# Patient Record
Sex: Female | Born: 1980 | Race: White | Hispanic: No | Marital: Married | Smoking: Never smoker
Health system: Southern US, Community
[De-identification: ages and names within clinical notes are randomized; demographics above are authoritative.]

## PROBLEM LIST (undated history)

## (undated) DIAGNOSIS — B019 Varicella without complication: Secondary | ICD-10-CM

## (undated) DIAGNOSIS — N390 Urinary tract infection, site not specified: Secondary | ICD-10-CM

## (undated) DIAGNOSIS — G8929 Other chronic pain: Secondary | ICD-10-CM

## (undated) DIAGNOSIS — R079 Chest pain, unspecified: Secondary | ICD-10-CM

## (undated) DIAGNOSIS — I10 Essential (primary) hypertension: Secondary | ICD-10-CM

## (undated) DIAGNOSIS — M25519 Pain in unspecified shoulder: Secondary | ICD-10-CM

## (undated) DIAGNOSIS — R519 Headache, unspecified: Secondary | ICD-10-CM

## (undated) HISTORY — DX: Headache, unspecified: R51.9

## (undated) HISTORY — DX: Other chronic pain: G89.29

## (undated) HISTORY — DX: Chest pain, unspecified: R07.9

## (undated) HISTORY — DX: Varicella without complication: B01.9

## (undated) HISTORY — DX: Urinary tract infection, site not specified: N39.0

## (undated) HISTORY — DX: Pain in unspecified shoulder: M25.519

---

## 1998-10-23 ENCOUNTER — Other Ambulatory Visit: Admission: RE | Admit: 1998-10-23 | Discharge: 1998-10-23 | Payer: Self-pay | Admitting: Obstetrics and Gynecology

## 2000-04-14 ENCOUNTER — Other Ambulatory Visit: Admission: RE | Admit: 2000-04-14 | Discharge: 2000-04-14 | Payer: Self-pay | Admitting: Obstetrics and Gynecology

## 2001-04-15 ENCOUNTER — Inpatient Hospital Stay (HOSPITAL_COMMUNITY): Admission: AD | Admit: 2001-04-15 | Discharge: 2001-04-15 | Payer: Self-pay | Admitting: Obstetrics and Gynecology

## 2001-04-22 ENCOUNTER — Inpatient Hospital Stay (HOSPITAL_COMMUNITY): Admission: AD | Admit: 2001-04-22 | Discharge: 2001-04-25 | Payer: Self-pay | Admitting: Obstetrics & Gynecology

## 2001-05-31 ENCOUNTER — Other Ambulatory Visit: Admission: RE | Admit: 2001-05-31 | Discharge: 2001-05-31 | Payer: Self-pay | Admitting: Obstetrics & Gynecology

## 2002-06-05 ENCOUNTER — Other Ambulatory Visit: Admission: RE | Admit: 2002-06-05 | Discharge: 2002-06-05 | Payer: Self-pay | Admitting: Obstetrics & Gynecology

## 2003-09-17 ENCOUNTER — Other Ambulatory Visit: Admission: RE | Admit: 2003-09-17 | Discharge: 2003-09-17 | Payer: Self-pay | Admitting: Obstetrics & Gynecology

## 2003-09-21 ENCOUNTER — Ambulatory Visit (HOSPITAL_COMMUNITY): Admission: RE | Admit: 2003-09-21 | Discharge: 2003-09-21 | Payer: Self-pay | Admitting: Obstetrics & Gynecology

## 2003-09-21 IMAGING — US US PELVIS COMPLETE MODIFY
1 series · 14 of 25 positions shown · non-contrast
Comparison: none

CLINICAL DATA: Lower abdominal pain.
 TRANSABDOMINAL AND TRANSVAGINAL PELVIC ULTRASOUND:
 Realtime multiplanar grayscale ultrasonograhy of the pelvis was performed using a transabdominal and endovaginal approach.
 The uterus measures 9.4 x 3.8 x 5.0 cm and is sonographically normal.  The endometrial stripe is 8 mm in thickness.  
 The right ovary measures 3.7 x 2.8 x 2.2 cm.  The left ovary measures 4.3 x 2.6 x 2.4 cm.  Both have normal sonographic features.  There is no intraperitoneal free fluid identified.  
 IMPRESSION
 Normal sonographic evaluation of the pelvis.

[Series 1: unknown · 0.32mm/px · 14 of 59 slices shown]
[im 1/59]
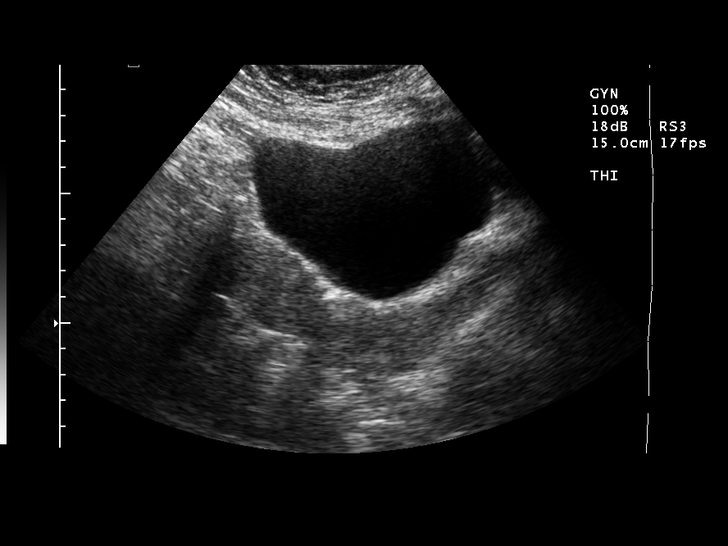
[im 5/59]
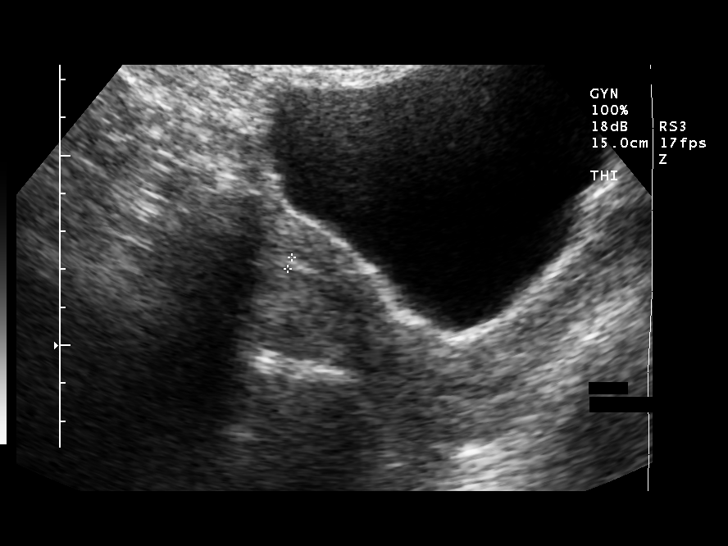
[im 10/59]
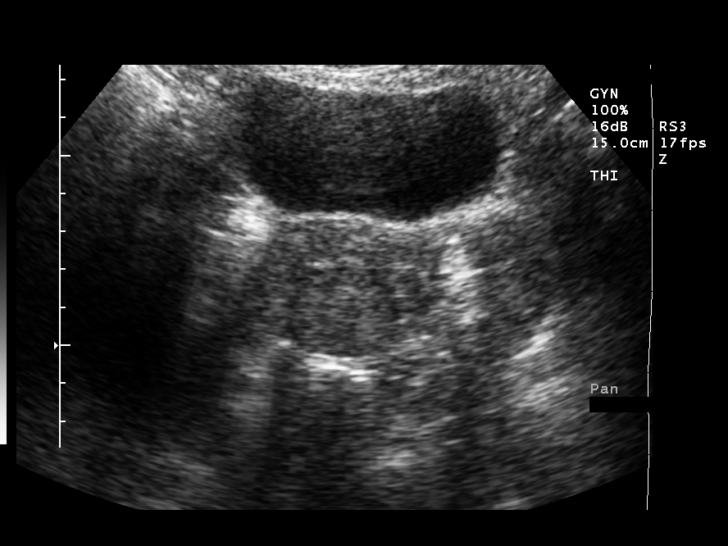
[im 15/59]
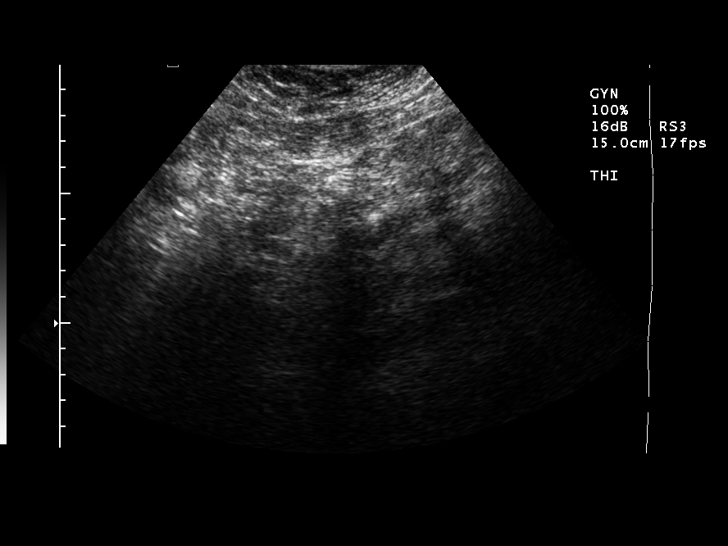
[im 20/59]
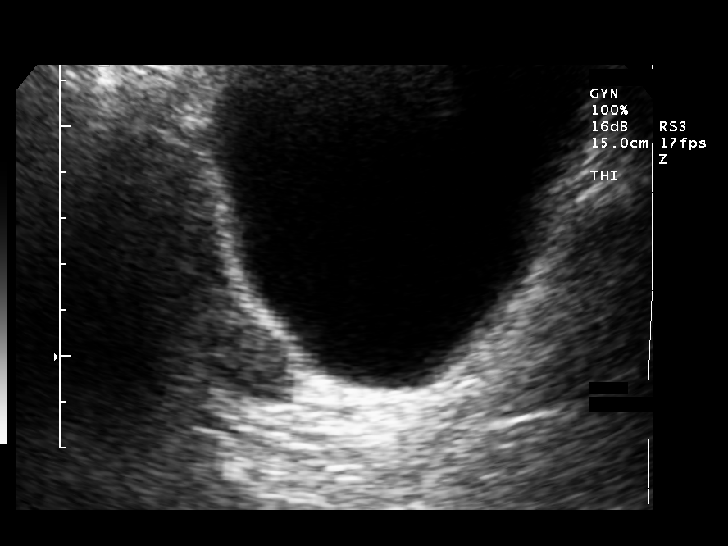
[im 22/59]
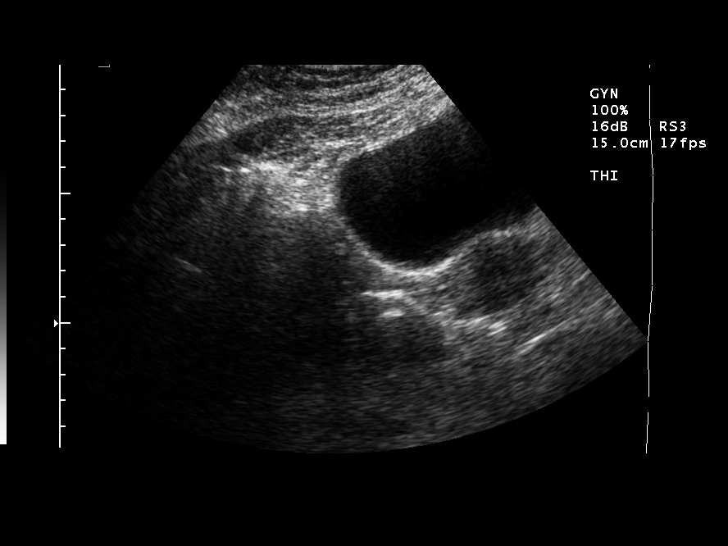
[im 27/59]
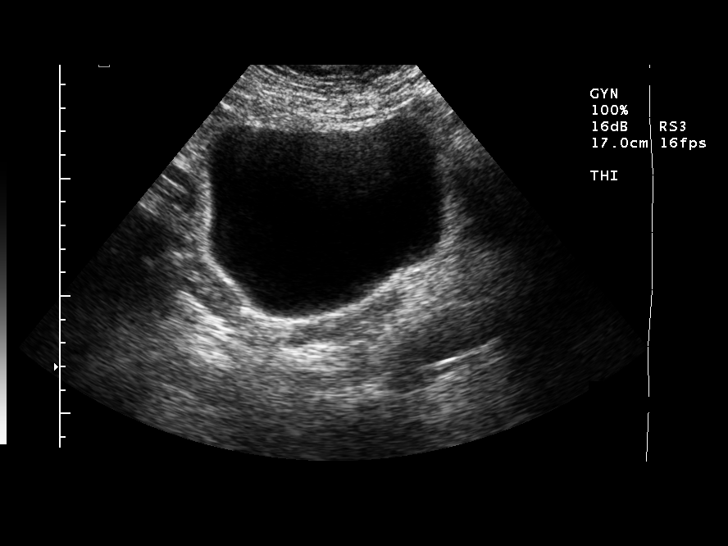
[im 32/59]
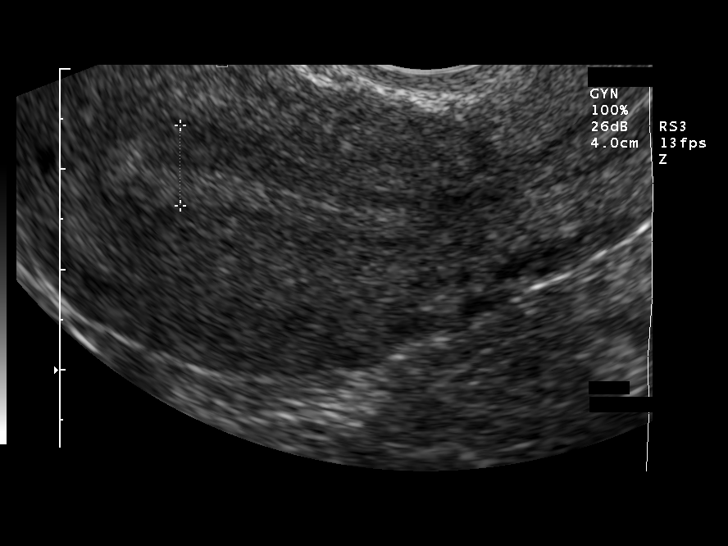
[im 37/59]
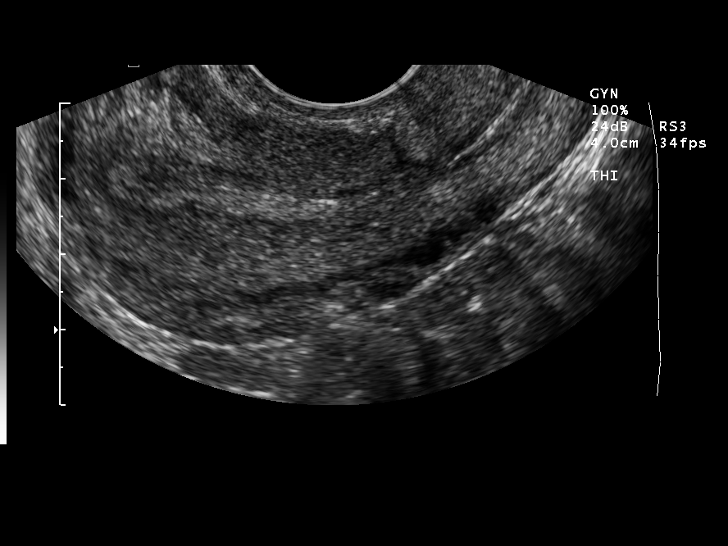
[im 39/59]
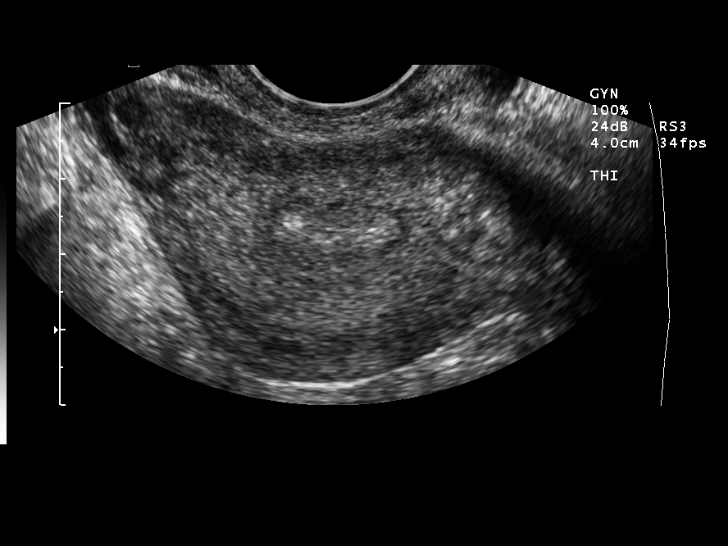
[im 44/59]
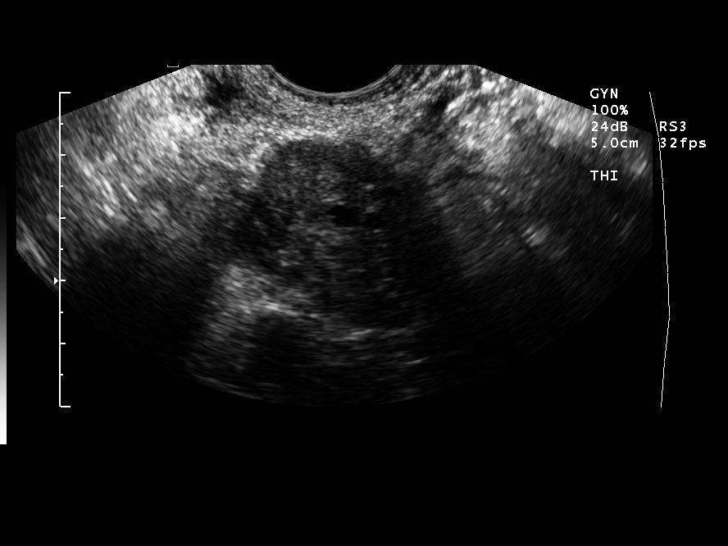
[im 49/59]
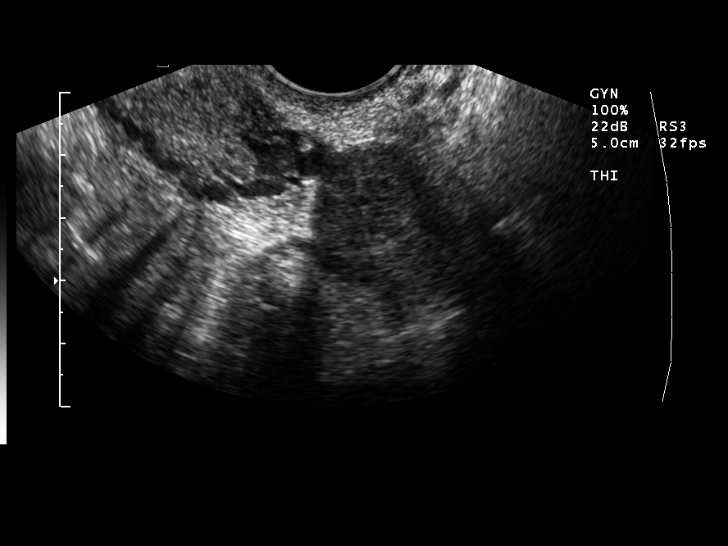
[im 54/59]
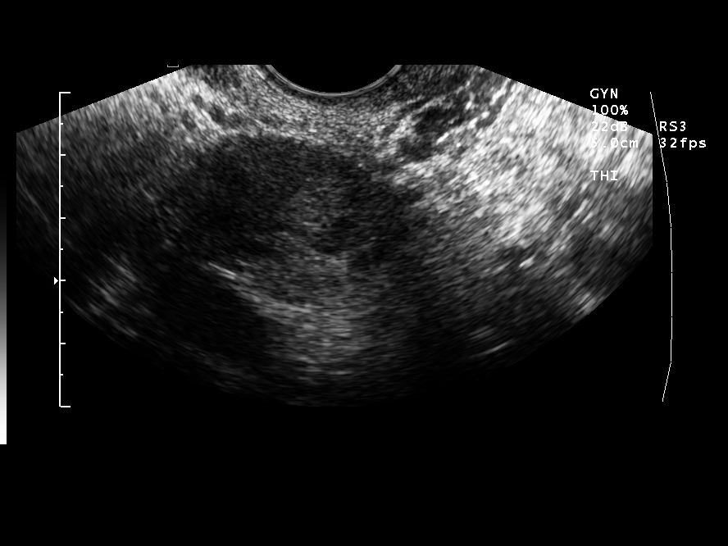
[im 59/59]
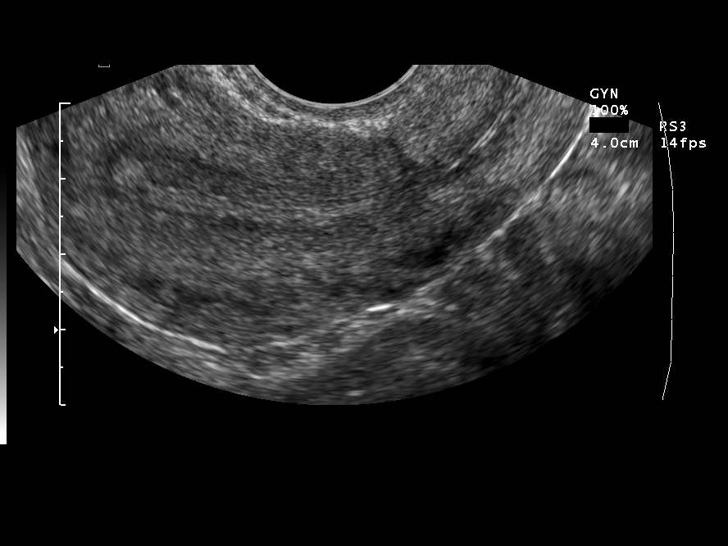

[14 of 25 positions shown; findings below may reference images not displayed]

## 2004-11-05 ENCOUNTER — Ambulatory Visit: Payer: Self-pay | Admitting: Family Medicine

## 2005-01-21 ENCOUNTER — Other Ambulatory Visit: Admission: RE | Admit: 2005-01-21 | Discharge: 2005-01-21 | Payer: Self-pay | Admitting: Obstetrics & Gynecology

## 2005-06-02 ENCOUNTER — Inpatient Hospital Stay (HOSPITAL_COMMUNITY): Admission: AD | Admit: 2005-06-02 | Discharge: 2005-06-02 | Payer: Self-pay | Admitting: Obstetrics and Gynecology

## 2005-08-28 ENCOUNTER — Inpatient Hospital Stay (HOSPITAL_COMMUNITY): Admission: AD | Admit: 2005-08-28 | Discharge: 2005-08-30 | Payer: Self-pay | Admitting: Obstetrics and Gynecology

## 2008-04-19 ENCOUNTER — Ambulatory Visit: Payer: Self-pay | Admitting: Family Medicine

## 2008-04-19 DIAGNOSIS — M5030 Other cervical disc degeneration, unspecified cervical region: Secondary | ICD-10-CM | POA: Insufficient documentation

## 2008-04-19 DIAGNOSIS — M722 Plantar fascial fibromatosis: Secondary | ICD-10-CM

## 2008-08-20 ENCOUNTER — Ambulatory Visit: Payer: Self-pay | Admitting: Family Medicine

## 2008-08-20 DIAGNOSIS — IMO0002 Reserved for concepts with insufficient information to code with codable children: Secondary | ICD-10-CM

## 2009-01-30 ENCOUNTER — Inpatient Hospital Stay (HOSPITAL_COMMUNITY): Admission: RE | Admit: 2009-01-30 | Discharge: 2009-02-01 | Payer: Self-pay | Admitting: Obstetrics & Gynecology

## 2009-01-30 ENCOUNTER — Encounter (INDEPENDENT_AMBULATORY_CARE_PROVIDER_SITE_OTHER): Payer: Self-pay | Admitting: Obstetrics & Gynecology

## 2009-11-26 ENCOUNTER — Ambulatory Visit: Payer: Self-pay | Admitting: Family Medicine

## 2009-11-26 DIAGNOSIS — L2089 Other atopic dermatitis: Secondary | ICD-10-CM

## 2010-05-20 ENCOUNTER — Ambulatory Visit: Payer: Self-pay | Admitting: Family Medicine

## 2010-05-20 DIAGNOSIS — N390 Urinary tract infection, site not specified: Secondary | ICD-10-CM

## 2010-05-20 LAB — CONVERTED CEMR LAB
Bilirubin Urine: NEGATIVE
Glucose, Urine, Semiquant: NEGATIVE
Protein, U semiquant: 30

## 2010-09-16 NOTE — Assessment & Plan Note (Signed)
Summary: UTI? // RS   Vital Signs:  Patient profile:   30 year old female Height:      65 inches Weight:      197 pounds BMI:     32.90 Temp:     98.3 degrees F oral BP sitting:   130 / 90  (left arm) Cuff size:   regular  Vitals Entered By: Kern Reap CMA Duncan Dull) (May 20, 2010 11:12 AM) CC: uti   CC:  uti.  History of Present Illness: [name] is a 30 year old, married female, nonsmoker.......Marland Kitchen birth control BTL....... LMP two weeks ago, who comes in today for evaluation of frequency, dysuria, and hematuria.  She has no fever.  Last urinary tract infection was many years ago.  Review of systems negative  Allergies: No Known Drug Allergies  Past History:  Past medical, surgical, family and social histories (including risk factors) reviewed for relevance to current acute and chronic problems.  Past Medical History: Reviewed history from 04/19/2008 and no changes required. Unremarkable childbirth x 2  Past Surgical History: Reviewed history from 04/20/2007 and no changes required. Denies surgical history  Family History: Reviewed history from 04/19/2008 and no changes required. mother has degenerative disk disease  Social History: Reviewed history from 04/20/2007 and no changes required. 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   Impression & Recommendations:  Problem # 1:  UTI (ICD-599.0) Assessment New  Orders: UA Dipstick w/Micro (automated) (81001)  Her updated medication list for this problem includes:    Septra Ds 800-160 Mg Tabs (Sulfamethoxazole-trimethoprim) .Marland Kitchen... Take 1 tablet by mouth two times a day  Complete Medication List: 1)  Septra Ds 800-160 Mg Tabs  (Sulfamethoxazole-trimethoprim) .... Take 1 tablet by mouth two times a day  Patient Instructions: 1)  drink 30 ounces of water daily. 2)  pyridium       3 times a day as needed for dysuria 3)  Septra DS one twice daily for 10 days.  Return p.r.n. Prescriptions: SEPTRA DS 800-160 MG TABS (SULFAMETHOXAZOLE-TRIMETHOPRIM) Take 1 tablet by mouth two times a day  #20 x 1   Entered and Authorized by:   Roderick Pee MD   Signed by:   Roderick Pee MD on 05/20/2010   Method used:   Electronically to        [facility] (retail)       [address]       [place], [place]  [address]       Ph: [phone]       Fax: [phone]   RxID:   [id number]   Laboratory Results   Urine Tests  Date/Time Received: May 20, 2010   Routine Urinalysis   Color: yellow Appearance: Cloudy Glucose: negative   (Normal Range: Negative) Bilirubin: negative   (Normal Range: Negative) Ketone: trace (5)   (Normal Range: Negative) Spec. Gravity: 1.025   (Normal Range: 1.003-1.035) Blood: large   (Normal Range: Negative) pH: 7.0   (Normal Range: 5.0-8.0) Protein: 30   (Normal Range: Negative) Urobilinogen: 0.2   (Normal Range: 0-1) Nitrite: negative   (Normal Range: Negative) Leukocyte Esterace: moderate   (Normal Range: Negative)    Comments: Kern Reap CMA Duncan Dull)  May 20, 2010 11:50 AM

## 2010-09-16 NOTE — Assessment & Plan Note (Signed)
Summary: skin irritation on eyelid of left eye/painful/scaly patcch/cjr   Vital Signs:  Patient profile:   30 year old female Weight:      184 pounds Temp:     98.0 degrees F oral BP sitting:   128 / 70  (right arm) Cuff size:   regular  Vitals Entered By: Kern Reap CMA Duncan Dull) (November 26, 2009 3:30 PM) CC: left eyelid scaly, rash   CC:  left eyelid scaly and rash.  History of Present Illness: [name] is a 30 year old female, G3, P3, who comes in today for evaluation of a rash on her left upper eyelid.  She has a history of allergic rhinitis and picularis now.  She has a dime-sized lesion on her left upper eyelid that won't go away.  Allergies: No Known Drug Allergies  Social History: Reviewed history from 04/20/2007 and no changes required. 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  Return.

## 2010-11-24 LAB — CBC
HCT: 31.8 % — ABNORMAL LOW (ref 36.0–46.0)
Hemoglobin: 10 g/dL — ABNORMAL LOW (ref 12.0–15.0)
MCV: 88.6 fL (ref 78.0–100.0)
MCV: 87.7 fL (ref 78.0–100.0)
Platelets: 163 10*3/uL (ref 150–400)
RBC: 3.28 MIL/uL — ABNORMAL LOW (ref 3.87–5.11)
WBC: 7.2 10*3/uL (ref 4.0–10.5)
WBC: 6.9 10*3/uL (ref 4.0–10.5)

## 2010-12-30 NOTE — Discharge Summary (Signed)
NAMEMARCELLINA, JONSSON               ACCOUNT NO.:  [id number]   MEDICAL RECORD NO.:  [id number]          PATIENT TYPE:  INP   LOCATION:  9139                          [facility]ACILITY:  [facility]   PHYSICIAN:  Gerrit Friends. Aldona Bar, M.D.   DATE OF BIRTH:  [birth year]   DATE OF ADMISSION:  01/30/2009  DATE OF DISCHARGE:  02/01/2009                               DISCHARGE SUMMARY   DISCHARGE DIAGNOSES:  1. Term pregnancy, delivered 7 pounds 2 ounces female infant, Apgars 2      and 8.  2. Blood type A+.  3. Repeat cesarean section.  4. Desire for tubal sterilization.   PROCEDURE:  Repeat low transverse cesarean section and tubal  sterilization.   SUMMARY:  This 30 year old, gravida 3, now para 3, was admitted on January 30, 2009, having had two previous cesarean sections for repeat cesarean  section at term.  She also desired permanent elective sterilization  procedure.  She was taken to the operating room on January 30, 2009, was  delivered 7 pounds 2 ounces female infant with Apgars 2 and 8, and  underwent a tubal sterilization procedure as well at that time.  Her  postoperative course was totally benign.  Her discharge hemoglobin was  10.0 (preop 11.2) with a white count of 7200, and platelet count of  141,000.  She remained afebrile during her entire hospital course.  She  was bottle feeding at the time of discharge.  She was appropriately  instructed on bottle-feeding.  On the second postop day, her wound was  clean and dry.  She was ambulating well, having normal bowel and bladder  function.  Vital signs were stable.  She was tolerating a regular diet  well, having minimal pain and was desirous of discharge.  Accordingly  she was given all appropriate instructions and understood all  instructions well.  She had some Percocet at home, which she will use as  needed along with over-the-counter Advil or Aleve as directed.  She will  also finish up her vitamins and continue on iron daily until her 4-week  checkup.  She will return to the office on February 04, 2009, to have her  wound inspected and her staples removed and her wound Steri-Stripped  appropriately.  She was given all appropriate instructions per discharge  brochure and understood all instructions well.   CONDITION ON DISCHARGE:  Improved.      Gerrit Friends. Aldona Bar, M.D.  Electronically Signed     RMW/MEDQ  D:  02/01/2009  T:  02/01/2009  Job:  [id number]

## 2010-12-30 NOTE — Op Note (Signed)
[name], [name]               ACCOUNT NO.:  [id number]   MEDICAL RECORD NO.:  [id number]          PATIENT TYPE:  INP   LOCATION:  9139                          [facility]ACILITY:  [facility]   PHYSICIAN:  Gerrit Friends. Aldona Bar, M.D.   DATE OF BIRTH:  1981-04-22   DATE OF PROCEDURE:  01/30/2009  DATE OF DISCHARGE:                               OPERATIVE REPORT   PREOPERATIVE DIAGNOSES:  Previous cesarean section x2, term pregnancy,  desire for repeat cesarean section, and tubal sterilization procedure.   POSTOPERATIVE DIAGNOSES:  Previous cesarean section x2, term pregnancy,  desire for repeat cesarean section, tubal sterilization procedure, and  delivered 7 pounds 2 ounces female infant with Apgars of 2 and 8.   PROCEDURE:  Repeat low transverse cesarean section and tubal  sterilization procedure.   SURGEON:  Gerrit Friends. Aldona Bar, MD   ASSISTANT:  Luvenia Redden, MD   ANESTHESIA:  Spinal, Dr. Jean Rosenthal.   HISTORY:  This 30 year old gravida 3, para 2, now at term, presents for  a repeat cesarean section, having had 2 previous cesarean sections.  She  also desires a permanent elective sterilization procedure and  understands such procedure is meant to be 100% permanent, but  unfortunately, it is not 100% perfect as subsequent pregnancy can  result.   According to her wishes, she is now being taken to the operating for  delivery by repeat cesarean section and will undergo a tubal  sterilization procedure.   DESCRIPTION OF PROCEDURE:  The patient was taken to the operating room,  after satisfactory induction of spinal anesthetic by Dr. Jean Rosenthal, she  was placed in supine position, slightly tilted left, and prepped and  draped in usual fashion with a Foley catheter was placed part of the  prep.  Once good anesthetic levels were documented, procedure was begun.  A Pfannenstiel incision was made with minimal difficulty dissected down  to and through the fascia in low transverse fashion with hemostasis  created at each layer.  Subfascial space was created inferiorly and  superiorly, muscles separated in the midline.  Peritoneum identified and  entered appropriate with care taken to avoid the bowel superiorly and  the bladder inferiorly.  At this time, the vesicouterine peritoneum was  incised in a low transverse fashion and pushed off lower uterine segment  with ease and thereafter sharp incision into the uterus with Metzenbaum  scissors, was then made in a low transverse fashion, extended laterally  with fingers.  Amniotomy was produced - clear fluid - and thereafter,  the vertex position a viable female infant which was somewhat floppy at  the time of birth because of some difficulty with pushing, was  delivered.  There was a double nuchal cord encountered.  Once the infant  was delivered, the cord was clamped and cut, and the infant was passed  off the awaiting team.  Subsequent Apgars were noted to be 2 and 8.  Infant was subsequently taken to the nursery in good condition.   After the cord bloods were collected, the placenta was delivered intact.  The uterus was then exteriorized, rendered free of  any remaining  products of conception.  Good uterine contractility was afforded, and  was slowly given intravenous Pitocin and manual stimulation.  At this  time, the uterine incision was closed using a single layer of #1 Vicryl  in a running locking fashion with several figure-of-eight #1 Vicryls  were placed for additional hemostasis.  At this time, the uterine  incision was dry.  Attention was turned to the tubes and ovaries both  which appeared totally normal.  In the midportion of the right fallopian  tube, a segment was elevated and free tie of #1 plain catgut suture was  tied down.  The knuckle of tube was excised and sent to Pathology.  Hemostasis was adequate.  The similar procedure was carried out on the  left fallopian tube, was accomplished in the tubal sterilization   procedure.   At this time with the uterus firm, uterine incision dry, all counts  correct - no foreign bodies noted to be remaining abdominal cavity.  Uterine incision was reinspected as well.  Abdomen was lavaged of all  free blood and clot.  At this time, closure of the abdomen was begun in  layers.  The abdominal peritoneum was closed with 0 Vicryl in a running  fashion.  Muscle secured with same.  Assured of good subfascial  hemostasis, the fascia was then reapproximated with 0 Vicryl from angle  to midline bilaterally.  Subcutaneous tissues were rendered hemostatic  and staples were used to close the skin.  Sterile pressure was then  applied.  At this time, the patient was taken to the recovery room in  satisfactory condition having tolerated the procedure well.  Estimated  blood loss 500 mL.  All counts correct x2.  At the conclusion of the  procedure, both mother and baby were doing well in their respective  recovery areas.  Pathologic specimen consisted of segment each fallopian  tube.  Condition on arrival in the recovery room was satisfactory.      Gerrit Friends. Aldona Bar, M.D.  Electronically Signed     RMW/MEDQ  D:  01/30/2009  T:  01/30/2009  Job:  [id number]

## 2011-01-02 NOTE — Op Note (Signed)
[facility]  Patient:    [name], [name] Visit Number: [id number] MRN: [id number]          Service Type: OBS Location: [facility] Attending Physician:  Marcelle Overlie Dictated by:   Caralyn Guile. Arlyce Dice, M.D. Proc. Date: 04/22/01                             Operative Report  PREOPERATIVE DIAGNOSIS:       Pregnancy at term, breech presentation.  POSTOPERATIVE DIAGNOSIS:      Pregnancy at term, breech presentation.  OPERATION:                    Primary low transverse cesarean section.  SURGEON:                      Richard D. Arlyce Dice, M.D.  ASSISTANTDebbe Bales A. Edward Jolly, M.D.  ANESTHESIA:                   Spinal.  ESTIMATED BLOOD LOSS:         500 cc.  FINDINGS:                     Female infant, birth weight 6 pounds 15 ounces, Apgar scores 8 and 9.  Normal-appearing tubes and ovaries.  Normal uterus.  INDICATIONS:                  This is a 30 year old, primigravida female who was found to have a breech presentation and attempted external cephalic version was done and this failed.  The following week the patient was seen in the office and was still in the breech presentation and she requested cesarean section for delivery.  DESCRIPTION OF PROCEDURE:     The patient was taken to the operating room and spinal anesthesia was placed.  The abdomen was then prepped and draped in the sterile fashion and a low transverse incision was made and carried down to the level of the fascia.  The fascia was entered transversely.  The rectus muscle was then divided in the midline.  The peritoneum was then identified and entered by blunt dissection and extended sharply.  The lower segment was then incised and entered.  The infant was then delivered in a frank breech presentation.  Cord bloods were obtained and the placenta was then delivered with traction on the cord.  The uterus was bluntly curettaged.  The lower segment was closed with a running  interlocking Vicryl and suture.  The peritoneum was closed with running 3-0 Vicryl sutures.  The rectus muscle was reapposed in the midline with the same suture that was used for the peritoneum.  The fascia was closed with running 0 Vicryl suture and the skin was closed with staples.  The bladder had been catheterized preoperatively and the patient left the operating room in good condition with an indwelling Foley catheter. Dictated by:   Caralyn Guile Arlyce Dice, M.D. Attending Physician:  Marcelle Overlie DD:  04/22/01 TD:  04/23/01 Job: [id number]

## 2011-01-02 NOTE — Op Note (Signed)
NAMEJANELLY, [name]               ACCOUNT NO.:  [id number]   MEDICAL RECORD NO.:  [id number]          PATIENT TYPE:  INP   LOCATION:  9112                          [facility]ACILITY:  [facility]   PHYSICIAN:  Randye Lobo, M.D.   DATE OF BIRTH:  February 06, 1981   DATE OF PROCEDURE:  08/28/2005  DATE OF DISCHARGE:                                 OPERATIVE REPORT   PREOPERATIVE DIAGNOSES:  1.  Intrauterine gestation at 31 plus 6 weeks.  2.  Latent phase labor.  3.  History of prior cesarean section, desires repeat cesarean section.   POSTOPERATIVE DIAGNOSES:  1.  Intrauterine gestation at 45 plus 6 weeks.  2.  Latent phase labor.  3.  History of prior cesarean section, desires repeat cesarean section.   PROCEDURE:  Repeat low segment transverse cesarean section.   SURGEON:  Conley Simmonds, MD.   ANESTHESIA:  Spinal.   IV FLUIDS:  2500 mL of Ringer's lactate.   ESTIMATED BLOOD LOSS:  700 mL.   URINE OUTPUT:  300 mL.   COMPLICATIONS:  None.   INDICATIONS FOR PROCEDURE:  The patient is a 24-year gravida 2, para 1-0-0-  1, Caucasian female at 51 +6 weeks' gestation (EDC of September 05, 2005,  by  first trimester ultrasound), who presented to the maternity admissions at  the [facility] reporting contractions and back pain of  increasing intensity.  The patient denied any leakage of fluid.  Her cervix  was examined and was 1 cm dilated.  The fetal heart rate tracing was  reactive and reassuring, and the contractions were noted to have an  irritable pattern.  The patient was observed and the contractions were  persistent and her cervix was therefore reexamined, and she was noted to be  3-4 cm at that time.  The patient was given a diagnosis of latent phase  labor.  She was scheduled for a repeat cesarean section on September 01, 2005,  after having counseling in the office regarding a repeat cesarean section  versus a VBAC.  A plan was made to again proceed with a repeat cesarean  section at this time after risks and benefits were reviewed.   FINDINGS:  A viable female was delivered at 10:36 a.m.  Apgars were 9 at one  minute and 9 at five minutes.  There was a nuchal cord x1.  The amniotic  fluid was clear.  The weight was 6 pounds 5 ounces.  The placenta had a  three-vessel cord.  The uterus, tubes, and ovaries were unremarkable.   SPECIMENS:  None.   PROCEDURE:  The patient was brought down to the operating room suite from  the maternity admissions area.  In the operating room the fetal heart rate  was reassuring at 140-160 beats.  A spinal anesthetic was administered and  the patient was then placed in the supine position with a left lateral tilt.  The abdomen was sterilely prepped and a Foley catheter sterilely placed in  the bladder.  She was sterilely draped.   After assuring adequate surgical anesthesia, a Pfannenstiel incision was  created sharply with the scalpel along the line of the patient's previous  incision.  This was carried down to the fascia using the scalpel.  The  fascia was incised in the midline and the incision was extended bilaterally  with the Mayo scissors.  The rectus muscles were dissected from the fascia  superiorly and inferiorly using the Mayo scissors.  The rectus muscles were  then sharply divided in the midline.  The parietal peritoneum was elevated  with two hemostat clamps was entered sharply with a Metzenbaum scissors.  The peritoneal incision was extended cranially and caudally.   The lower uterine segment was exposed with a bladder retractor and a bladder  flap was sharply created.  A transverse lower uterine segment incision was  then created with the scalpel.  The incision was extended bilaterally in an  upward fashion using a bandage scissors.  Membranes were ruptured with an  Allis clamp.  A hand was inserted through the uterine incision.  The  placenta was manually extracted and then she received Pitocin 20 units  IV.  The uterus was exteriorized at this time and a moistened lap pad was used to  remove any remaining products of conception.  The uterus was then closed  with a double-layer closure of #1 chromic.  The first with a running locked  layer and the second was an imbricating layer.  The lower uterine segment  inferiorly was noted be quite thin from the patient's laboring process.  This was reinforced with some 2-0 Vicryl in figure ofeight fashion.  The  bladder flap was also closed with a running locked suture of 3-0 vicryl.  The uterus was returned to the peritoneal cavity, which was suctioned and  irrigated.  At this time the operative site was reexamined and was  hemostatic.   The abdomen was closed.  A 2-0 Vicryl was used to close the parietal  peritoneum.  The rectus muscles were reapproximated using interrupted  sutures of #1 chromic.  The fascia was closed with 0 Vicryl.  The  subcutaneous tissue was irrigated and suctioned and made hemostatic with  monopolar cautery.  The skin was closed with staples.  A sterile pressure  bandage was placed over this.   This ended the patient's procedure.  There were no complications.  All  needle, instrument, and sponge counts were correct.  The patient was  escorted to the recovery room in stable and awake condition.      Randye Lobo, M.D.  Electronically Signed     BES/MEDQ  D:  08/28/2005  T:  08/28/2005  Job:  [id number]

## 2011-01-02 NOTE — Discharge Summary (Signed)
[facility]  Patient:    [name], [name] Visit Number: [id number] MRN: [id number]          Service Type: OBS Location: 910A 9102 [id number] Attending Physician:  Lars Pinks Dictated by:   Caralyn Guile Arlyce Dice, M.D. Admit Date:  04/22/2001 Discharge Date: 04/25/2001                             Discharge Summary  FINAL DIAGNOSES:  Term pregnancy, breech presentation.  SECONDARY DIAGNOSIS:  None.  PROCEDURE:  Primary low-transverse cesarean section.  COMPLICATIONS:  None.  CONDITION ON DISCHARGE:  Improved.  HISTORY OF PRESENT ILLNESS:  This is a 30 year old gravida 1 female who was found to have a breech presentation during the last month of her pregnancy. An attempted cephalic conversion was done and failed to create a vertex presentation.  The patient was followed in the office and the breech presentation persisted and the patient requested a cesarean section for delivery.  HOSPITAL COURSE:  The patient was taken to the operating room on the day of admission where a primary low-transverse cesarean section was performed without complications.  A female infant, birth weight of 6 pounds 15 ounces, Apgar scores 8/9, was delivered by Dr. Ilda Mori without problems.  The patients postoperative course was benign without significant fever or anemia. On the third postoperative day, the patient was felt to be ready for discharge.  She was discharged on a regular diet, told to limit her activity. She was given Percocet #20 to take 1-2 every 4 hours for pain and also told to take over-the-counter pain medication and her prenatal vitamins.  She was asked to return to the office in four weeks for followup evaluation.  LABORATORY DATA:  Revealed a preoperative hemoglobin of 13.5, white count of 8200 and a platelet count of 180,000.  Postoperatively, her hemoglobin was 12.1 with a white count of 9400 and her platelet counts were 114.  These  were repeated 24 hours later and her hemoglobin was up to 13 and her platelet count was 154.  Her urinalysis was benign. Dictated by:   Caralyn Guile Arlyce Dice, M.D. Attending Physician:  Lars Pinks DD:  05/16/01 TD:  05/16/01 Job: [id number]

## 2011-01-02 NOTE — Discharge Summary (Signed)
NAMEMARLIYAH, [name]               ACCOUNT NO.:  [id number]   MEDICAL RECORD NO.:  [id number]          PATIENT TYPE:  INP   LOCATION:  9112                          FACILITY:  [facility]   PHYSICIAN:  Randye Lobo, M.D.   DATE OF BIRTH:  1980/12/22   DATE OF ADMISSION:  08/28/2005  DATE OF DISCHARGE:  08/30/2005                                 DISCHARGE SUMMARY   FINAL DIAGNOSES:  1.  Intrauterine pregnancy at 32 and six-sevenths weeks gestation.  2.  History of prior cesarean section, desires repeat cesarean section in      latent phase of labor.   PROCEDURE:  Repeat low transverse cesarean section. Surgeon:  Dr. Conley Simmonds. Complications:  None.   This 30 year old G2 P1-0-0-1 presents at 49 and six-sevenths weeks gestation  in early labor. The patient was only 1 cm dilated at this time and  contractions were in an irritable pattern. The patient was observed and the  contractions were persistent and cervix changed. The patient had been  scheduled for a repeat cesarean section on January 16 and declines VBAC at  this time. Her antepartum course up to this point had been uncomplicated.  She had a negative group B strep culture obtained in the office at 36 weeks.  She was taken to the operating room on January12,2007, by Dr. Conley Simmonds  where a repeat low segment transverse cesarean section was performed with  the delivery of a 6-pound 5-ounce female infant with Apgars of 9 and 9.  Delivery went without complications. The patient's postoperative course was  benign without any significant fevers. She was felt ready for discharge on  postoperative day #2. Was sent home on a regular diet, told to decrease  activities, told to continue her prenatal vitamins, was given a prescription  for Percocet one to two every 4 hours as needed for pain, told she could use  over-the-counter ibuprofen up to 600 mg every 6 hours as needed for pain,  and was to return to the office in 2 days for her staple  removal.   LABORATORY ON DISCHARGE:  The patient had a hemoglobin of 12.1; white blood  cell count of 9.7; and platelets of 145,000.      [name], P.A.-C.      Randye Lobo, M.D.  Electronically Signed    MB/MEDQ  D:  09/21/2005  T:  09/21/2005  Job:  [id number]

## 2012-03-02 ENCOUNTER — Ambulatory Visit (INDEPENDENT_AMBULATORY_CARE_PROVIDER_SITE_OTHER): Payer: BC Managed Care – PPO | Admitting: Family Medicine

## 2012-03-02 ENCOUNTER — Encounter: Payer: Self-pay | Admitting: Family Medicine

## 2012-03-02 VITALS — BP 120/80 | Temp 98.6°F | Ht 68.0 in | Wt 204.0 lb

## 2012-03-02 DIAGNOSIS — J330 Polyp of nasal cavity: Secondary | ICD-10-CM

## 2012-03-02 NOTE — Progress Notes (Signed)
  Subjective:    Patient ID: [name], female    DOB: 14-Apr-1981, 31 y.o.   MRN: [id number]  HPI Devota is a 31 year old female married nonsmoker who comes in today for treatment of a nosebleed for 2 years  For the past 2 years she's had intermittent bleeding of the right nostril. No history of trauma   Review of Systems    ENT review of systems otherwise negative Objective:   Physical Exam Well-developed well nourished female no acute distress examination nose shows the septum is in midline left nares is normal. The right shows a 3 mm polyp anteriorly that was cauterized with silver nitrate       Assessment & Plan:  Bleeding nasal polyp ENT consult if bleeding persists

## 2012-10-11 ENCOUNTER — Encounter: Payer: Self-pay | Admitting: Family Medicine

## 2012-10-11 ENCOUNTER — Ambulatory Visit (INDEPENDENT_AMBULATORY_CARE_PROVIDER_SITE_OTHER): Payer: BC Managed Care – PPO | Admitting: Family Medicine

## 2012-10-11 VITALS — BP 110/80 | HR 98 | Temp 98.8°F | Wt 203.0 lb

## 2012-10-11 DIAGNOSIS — R0789 Other chest pain: Secondary | ICD-10-CM | POA: Insufficient documentation

## 2012-10-11 DIAGNOSIS — R071 Chest pain on breathing: Secondary | ICD-10-CM

## 2012-10-11 NOTE — Progress Notes (Signed)
  Subjective:    Patient ID: [name], female    DOB: June 20, 1981, 32 y.o.   MRN: [id number]  HPI [name] is a 32 year old married female nonsmoker who comes in with a month's history of left upper anterior chest wall pain  She describes the pain as somewhat dull constant a 4 on a scale of 1-10 it hurts when she takes a deep breath or moves. She also has a history of allergic rhinitis with postnasal drip no history of reflux although she is slightly overweight. She and her husband to start an exercise program  No cough no cardiac symptoms   Review of Systems    cardiopulmonary review of systems negative Objective:   Physical Exam Well-developed well nourished female no acute distress examination the lungs is normal however I checked her lung she is a black lesion on her right hip advised to return for removal ASAP  Cardiac pulmonary breast exam normal  Total body skin exam normal except for folliculitis on her buttocks and dry skin on both arms       Assessment & Plan:  Chest wall pain and Motrin 600 mg twice a day with food  Abnormal mole hip return for removal this spring

## 2012-10-11 NOTE — Patient Instructions (Signed)
Motrin 600 mg twice daily with food for the chest wall pain when necessary  Set up a time in about a month for removal of the lesion on your right hip

## 2012-11-08 ENCOUNTER — Ambulatory Visit: Payer: BC Managed Care – PPO | Admitting: Family Medicine

## 2012-11-29 ENCOUNTER — Ambulatory Visit (INDEPENDENT_AMBULATORY_CARE_PROVIDER_SITE_OTHER): Payer: BC Managed Care – PPO | Admitting: Family Medicine

## 2012-11-29 DIAGNOSIS — D239 Other benign neoplasm of skin, unspecified: Secondary | ICD-10-CM

## 2012-11-29 NOTE — Patient Instructions (Signed)
Remove the Band-Aid tomorrow  We will call you within 2 weeks with the

## 2012-11-29 NOTE — Progress Notes (Signed)
  Subjective:    Patient ID: [name], female    DOB: September 08, 1980, 32 y.o.   MRN: [id number]  HPI [name] is a 32 year old female who comes in today for removal of a dark lesion on her right buttocks. The lesion is actually in the L4-L5 area just above the hip.  After informed consent the lesion was cleaned with alcohol and anesthetized with 1% Xylocaine with epinephrine. It was excised with 3 mm margins and sent for pathologic analysis. The bases cauterized Band-Aids applied she tolerated the procedure no complications   Review of Systems Review of systems negative    Objective:   Physical Exam Procedure see above       Assessment & Plan:  Clinically it appears to be a dysplastic nevus  Pending

## 2014-07-06 ENCOUNTER — Other Ambulatory Visit: Payer: Self-pay

## 2014-07-10 LAB — CYTOLOGY - PAP

## 2015-04-13 ENCOUNTER — Encounter (HOSPITAL_COMMUNITY): Payer: Self-pay | Admitting: Emergency Medicine

## 2015-04-13 ENCOUNTER — Emergency Department (HOSPITAL_COMMUNITY)
Admission: EM | Admit: 2015-04-13 | Discharge: 2015-04-13 | Disposition: A | Discharge status: Home / Self Care | Payer: BLUE CROSS/BLUE SHIELD | Source: Home / Self Care | Attending: Family Medicine | Admitting: Family Medicine

## 2015-04-13 DIAGNOSIS — J0101 Acute recurrent maxillary sinusitis: Secondary | ICD-10-CM

## 2015-04-13 MED ORDER — IPRATROPIUM BROMIDE 0.06 % NA SOLN: 2.0000 | Freq: Four times a day (QID) | NASAL | Status: DC

## 2015-04-13 MED ORDER — DOXYCYCLINE HYCLATE 100 MG PO CAPS: 100.0000 mg | ORAL_CAPSULE | Freq: Two times a day (BID) | ORAL | Status: DC

## 2015-04-13 NOTE — ED Provider Notes (Signed)
CSN: [id number]     Arrival date & time 04/13/15  1739 History   First MD Initiated Contact with Patient 04/13/15 1802     Chief Complaint  Patient presents with  . URI   (Consider location/radiation/quality/duration/timing/severity/associated sxs/prior Treatment) Patient is a 34 y.o. female presenting with URI. The history is provided by the patient.  URI Presenting symptoms: congestion, cough, facial pain, fever and rhinorrhea   Severity:  Moderate Onset quality:  Gradual Duration:  2 days Progression:  Unchanged Chronicity:  New Relieved by:  None tried Worsened by:  Nothing tried Associated symptoms: sinus pain   Risk factors: sick contacts   Risk factors comment:  2 children have been ill.   History reviewed. No pertinent past medical history. History reviewed. No pertinent past surgical history. No family history on file. Social History  Substance Use Topics  . Smoking status: Never Smoker   . Smokeless tobacco: None  . Alcohol Use: No   OB History    No data available     Review of Systems  Constitutional: Positive for fever.  HENT: Positive for congestion, postnasal drip, rhinorrhea and sinus pressure.   Respiratory: Positive for cough. Negative for shortness of breath.   Cardiovascular: Negative.     Allergies  Review of patient's allergies indicates no known allergies.  Home Medications   Prior to Admission medications   Medication Sig Start Date End Date Taking? Authorizing Provider  Chlorphen-Pseudoephed-APAP [facility] FLU/COLD PO) Take by mouth.   Yes Historical Provider, MD  ibuprofen (ADVIL,MOTRIN) 100 MG chewable tablet Chew by mouth every 8 (eight) hours as needed.   Yes Historical Provider, MD  OVER THE COUNTER MEDICATION Cold medicine   Yes Historical Provider, MD  OVER THE COUNTER MEDICATION Birth control   Yes Historical Provider, MD  doxycycline (VIBRAMYCIN) 100 MG capsule Take 1 capsule (100 mg total) by mouth 2 (two) times daily. 04/13/15    Billy Fischer, MD  ipratropium (ATROVENT) 0.06 % nasal spray Place 2 sprays into both nostrils 4 (four) times daily. 04/13/15   Billy Fischer, MD   Meds Ordered and Administered this Visit  Medications - No data to display  BP 145/98 mmHg  Pulse 111  Temp(Src) 99.4 F (37.4 C) (Oral)  Resp 20  SpO2 95% No data found.   Physical Exam  Constitutional: She is oriented to person, place, and time. She appears well-developed and well-nourished. No distress.  HENT:  Right Ear: External ear normal.  Left Ear: External ear normal.  Nose: Rhinorrhea present. Right sinus exhibits maxillary sinus tenderness. Left sinus exhibits maxillary sinus tenderness.  Mouth/Throat: Oropharynx is clear and moist.  Neck: Normal range of motion. Neck supple.  Cardiovascular: Normal heart sounds.   Pulmonary/Chest: Effort normal and breath sounds normal. She has no rales.  Lymphadenopathy:    She has no cervical adenopathy.  Neurological: She is alert and oriented to person, place, and time.  Skin: Skin is warm and dry.  Nursing note and vitals reviewed.   ED Course  Procedures (including critical care time)  Labs Review Labs Reviewed - No data to display  Imaging Review No results found.   Visual Acuity Review  Right Eye Distance:   Left Eye Distance:   Bilateral Distance:    Right Eye Near:   Left Eye Near:    Bilateral Near:         MDM   1. Acute recurrent maxillary sinusitis       Billy Fischer,  MD 04/13/15 1813

## 2015-04-13 NOTE — Discharge Instructions (Signed)
Drink plenty of fluids as discussed, use medicine as prescribed, and mucinex or delsym for cough. Return or see your doctor if further problems °

## 2015-04-13 NOTE — ED Notes (Signed)
Pressure in head, cough, teeth are sore, runny nose intermittently, stuffy nose otherwise, fever and yellow phlegm

## 2015-11-25 ENCOUNTER — Telehealth: Payer: Self-pay | Admitting: *Deleted

## 2015-11-25 NOTE — Telephone Encounter (Signed)
Patient is requesting to establish care with Dr Martinique.  Please call and schedule the patient.

## 2015-12-09 NOTE — Telephone Encounter (Signed)
Left message for pt to call back and schedule appointment.

## 2019-09-04 ENCOUNTER — Other Ambulatory Visit: Payer: Self-pay

## 2019-09-04 ENCOUNTER — Ambulatory Visit: Payer: BLUE CROSS/BLUE SHIELD | Attending: Internal Medicine

## 2019-09-04 DIAGNOSIS — Z20822 Contact with and (suspected) exposure to covid-19: Secondary | ICD-10-CM

## 2019-09-05 LAB — NOVEL CORONAVIRUS, NAA: SARS-CoV-2, NAA: DETECTED — AB

## 2019-09-29 ENCOUNTER — Ambulatory Visit (INDEPENDENT_AMBULATORY_CARE_PROVIDER_SITE_OTHER): Payer: 59 | Admitting: Family Medicine

## 2019-09-29 ENCOUNTER — Encounter: Payer: Self-pay | Admitting: Family Medicine

## 2019-09-29 ENCOUNTER — Other Ambulatory Visit: Payer: Self-pay

## 2019-09-29 VITALS — BP 134/82 | HR 93 | Temp 98.1°F | Ht 68.0 in | Wt 214.2 lb

## 2019-09-29 DIAGNOSIS — Z13220 Encounter for screening for lipoid disorders: Secondary | ICD-10-CM | POA: Diagnosis not present

## 2019-09-29 DIAGNOSIS — Z130 Encounter for screening for diseases of the blood and blood-forming organs and certain disorders involving the immune mechanism: Secondary | ICD-10-CM

## 2019-09-29 DIAGNOSIS — N946 Dysmenorrhea, unspecified: Secondary | ICD-10-CM | POA: Diagnosis not present

## 2019-09-29 DIAGNOSIS — Z0000 Encounter for general adult medical examination without abnormal findings: Secondary | ICD-10-CM

## 2019-09-29 DIAGNOSIS — R197 Diarrhea, unspecified: Secondary | ICD-10-CM | POA: Diagnosis not present

## 2019-09-29 NOTE — Patient Instructions (Addendum)
Health Maintenance Due  Topic Date Due  . HIV Screening had during pregnancy updated records  03/05/1994  . TETANUS/TDAP - Tdap - call back for nurse visit 03/05/1998  . PAP SMEAR-Modifier will send for records  07/06/2017   Schedule a lab visit at the check out desk within 2 weeks. Return for future fasting labs meaning nothing but water after midnight please. Ok to take your medications with water.   Recommended follow up: Return in about 1 year (around 09/28/2020) for physical or sooner if needed.

## 2019-09-29 NOTE — Progress Notes (Signed)
Phone: [phone]   Subjective:  Patient presents today to establish care.  Prior patient of Dr. Sherren Mocha .  Chief Complaint  Patient presents with  . New Patient (Initial Visit)   See problem oriented charting  The following were reviewed and entered/updated in epic: Past Medical History:  Diagnosis Date  . Chicken pox   . UTI (urinary tract infection)    Patient Active Problem List   Diagnosis Date Noted  . Dysmenorrhea 09/29/2019    Priority: Medium  . Dysplastic nevi 11/29/2012    Priority: Low  . Polyp of anterior nasal cavity 03/02/2012    Priority: Low  . Atopic dermatitis 11/26/2009    Priority: Low  . [place] DISEASE, CERVICAL 04/19/2008    Priority: Low  . 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 CESAREAN SECTION     2002,2007,2010  . TUBAL LIGATION  2010    Family History  Problem Relation Age of Onset  . Asthma Mother   . Diabetes Mother   . Healthy Father   . Kidney Stones Brother   . Kidney Stones Sister   . Kidney Stones Brother   . Other Maternal Grandmother        83 years old in 2021    Medications- reviewed and updated Current Outpatient Medications  Medication Sig Dispense Refill  . ibuprofen (ADVIL,MOTRIN) 100 MG chewable tablet Chew by mouth every 8 (eight) hours as needed.    . Norethin Ace-Eth Estrad-FE (TAYTULLA PO) Take by mouth.     No current facility-administered medications for this visit.    Allergies-reviewed and updated No Known Allergies  Social History   Social History Narrative   Married. 3 daughters 1, 51, and 10 in 2021.       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       Hobbies:crafters, reading, family time, music    Objective  Objective:  BP 134/82   Pulse 93   Temp 98.1 F (36.7 C) (Temporal)   Ht '5\' 8"'  (1.727 m)   Wt 214 lb 3.2 oz (97.2 kg)   SpO2 97%   BMI 32.57 kg/m  Gen: NAD, resting comfortably HEENT: Mask not removed due  to covid 19. TM normal. Bridge of nose normal. Eyelids normal.  Neck: no thyromegaly or cervical lymphadenopathy  Eyes: sclera and lids normal, PERRLA Neck: no thyromegaly, no cervical lymphadenopathy CV: RRR no murmurs rubs or gallops Lungs: CTAB no crackles, wheeze, rhonchi Abdomen: soft/nontender/nondistended/normal bowel sounds. No rebound or guarding.  Ext: no edema Skin: warm, dry Neuro: 5/5 strength in upper and lower extremities, normal gait, normal reflexes     Assessment and Plan   39 y.o. female presenting for annual physical.  Health Maintenance counseling: 1. Anticipatory guidance: Patient counseled regarding regular dental exams -q6 months, eye exams - yearly,  avoiding smoking and second hand smoke , limiting alcohol to 1 beverage per day- doesn't drink .   2. Risk factor reduction:  Advised patient of need for regular exercise and diet rich and fruits and vegetables to reduce risk of heart attack and stroke. Exercise- walks and rides bike as family- wants to lose weight- 150 minutes a week. Diet-family doesn't like veggies which makes it hard and eats out fair amount.  Wt Readings from Last 3 Encounters:  09/29/19 214 lb 3.2 oz (97.2 kg)  10/11/12 203 lb (92.1 kg)  03/02/12 204 lb (92.5 kg)  3.  Immunizations/screenings/ancillary studies- Tdap today. Declines flu shot.  4. Cervical cancer screening- within 3 years with Dr. Runell Gess- gets yearly- request records 5. Breast cancer screening-  breast exam with Dr. Runell Gess and mammogram - considering starting in next year or two 6. Colon cancer screening - no family history, start at age 74 7. Skin cancer screening- no dermatologist. advised regular sunscreen use. Denies worrisome, changing, or new skin lesions.  8. Birth control/STD check- tubal ligation plus on birth control for heavy periods 9. Osteoporosis screening at 65- will plan on this -never smoker  Status of chronic or acute concerns   # Loose frequent  stools. S: greasy foods tend to cause loose stools and comes out quickly. On a normal day would have 2-3 a day. No blood in stool or mucus. Mom has similar GI issues.  5 years of issues. Probiotics help. Sweet tea or greasy foods can make stools looser A/P: Unclear etiology but may be IBS.  Patient is worried about possible inflammatory bowel disease Has minimal red flags but-we will get CBC, CRP, ESR-if not elevated would make less likely.  - low fodmap diet recommended  Recommended follow up: Return in about 1 year (around 09/28/2020) for physical or sooner if needed.  Lab/Order associations: not fasting   ICD-10-CM   1. Preventative health care  Z00.00   2. Diarrhea, unspecified type  R19.7 CBC with Differential/Platelet    C-reactive protein    Sedimentation rate    Comprehensive metabolic panel  3. Dysmenorrhea  N94.6   4. Screening for hyperlipidemia  Z13.220            5.   Screening for deficiency anemia  Z13.0 CBC with Differential/Platelet   Return precautions advised.  Garret Reddish, MD

## 2019-10-27 ENCOUNTER — Other Ambulatory Visit: Payer: 59

## 2019-10-27 ENCOUNTER — Other Ambulatory Visit: Payer: Self-pay

## 2019-10-27 ENCOUNTER — Other Ambulatory Visit (INDEPENDENT_AMBULATORY_CARE_PROVIDER_SITE_OTHER): Payer: 59

## 2019-10-27 DIAGNOSIS — R197 Diarrhea, unspecified: Secondary | ICD-10-CM

## 2019-10-27 DIAGNOSIS — Z13220 Encounter for screening for lipoid disorders: Secondary | ICD-10-CM | POA: Diagnosis not present

## 2019-10-27 DIAGNOSIS — Z130 Encounter for screening for diseases of the blood and blood-forming organs and certain disorders involving the immune mechanism: Secondary | ICD-10-CM | POA: Diagnosis not present

## 2019-10-27 LAB — COMPREHENSIVE METABOLIC PANEL
ALT: 22 U/L (ref 0–35)
AST: 25 U/L (ref 0–37)
Albumin: 3.9 g/dL (ref 3.5–5.2)
Alkaline Phosphatase: 72 U/L (ref 39–117)
BUN: 12 mg/dL (ref 6–23)
CO2: 25 mEq/L (ref 19–32)
Calcium: 9 mg/dL (ref 8.4–10.5)
Chloride: 104 mEq/L (ref 96–112)
Creatinine, Ser: 0.7 mg/dL (ref 0.40–1.20)
GFR: 92.38 mL/min (ref >60.00)
Glucose, Bld: 95 mg/dL (ref 70–99)
Potassium: 4.1 mEq/L (ref 3.5–5.1)
Sodium: 137 mEq/L (ref 135–145)
Total Bilirubin: 0.4 mg/dL (ref 0.2–1.2)
Total Protein: 7 g/dL (ref 6.0–8.3)

## 2019-10-27 LAB — CBC WITH DIFFERENTIAL/PLATELET
Basophils Absolute: 0.1 10*3/uL (ref 0.0–0.1)
Basophils Relative: 1.4 % (ref 0.0–3.0)
Eosinophils Absolute: 0.1 10*3/uL (ref 0.0–0.7)
Eosinophils Relative: 1.6 % (ref 0.0–5.0)
HCT: 42.4 % (ref 36.0–46.0)
Hemoglobin: 14.6 g/dL (ref 12.0–15.0)
Lymphocytes Relative: 36.5 % (ref 12.0–46.0)
Lymphs Abs: 2.2 10*3/uL (ref 0.7–4.0)
MCHC: 34.3 g/dL (ref 30.0–36.0)
MCV: 91.4 fl (ref 78.0–100.0)
Monocytes Absolute: 0.4 10*3/uL (ref 0.1–1.0)
Monocytes Relative: 6 % (ref 3.0–12.0)
Neutro Abs: 3.3 10*3/uL (ref 1.4–7.7)
Neutrophils Relative %: 54.5 % (ref 43.0–77.0)
Platelets: 231 10*3/uL (ref 150.0–400.0)
RBC: 4.64 Mil/uL (ref 3.87–5.11)
RDW: 12.6 % (ref 11.5–15.5)
WBC: 6.1 10*3/uL (ref 4.0–10.5)

## 2019-10-27 LAB — LIPID PANEL
Cholesterol: 184 mg/dL (ref 0–200)
HDL: 42.8 mg/dL (ref >39.00)
LDL Cholesterol: 103 mg/dL — ABNORMAL HIGH (ref 0–99)
NonHDL: 140.98
Total CHOL/HDL Ratio: 4
Triglycerides: 188 mg/dL — ABNORMAL HIGH (ref 0.0–149.0)
VLDL: 37.6 mg/dL (ref 0.0–40.0)

## 2019-10-27 LAB — SEDIMENTATION RATE: Sed Rate: 23 mm/hr — ABNORMAL HIGH (ref 0–20)

## 2019-10-27 LAB — C-REACTIVE PROTEIN: CRP: 1.3 mg/dL (ref 0.5–20.0)

## 2019-11-03 ENCOUNTER — Telehealth: Payer: Self-pay | Admitting: Family Medicine

## 2019-11-03 NOTE — Telephone Encounter (Signed)
Per lab message patient has seen results on my chart.

## 2019-11-03 NOTE — Telephone Encounter (Signed)
Patient is returning [name]'s call about lab results.  

## 2019-11-08 ENCOUNTER — Encounter: Payer: Self-pay | Admitting: Family Medicine

## 2020-08-20 ENCOUNTER — Encounter: Payer: Self-pay | Admitting: Family Medicine

## 2021-01-01 NOTE — Patient Instructions (Addendum)
Health Maintenance Due  Topic Date Due  . TETANUS/TDAP - today Never done  . PAP SMEAR-Scheduled for June, Sign a release for at check out for last pap smear 07/06/2017   Please stop by x-ray and labs before you go If you have mychart- we will send your results within 3 business days of [place] receiving them.  If you do not have mychart- we will call you about results within 5 business days of [place] receiving them.   We will call you within two weeks about your referral for MRI brain. If you do not hear within 2 weeks, give [place] a call.   Your blood pressure trend concerns me. I would like for you to buy/use a home cuff to check at least 4x a week. Your goal is <135/85.  Bring your home cuff and your log of blood pressures with you to next visit. Update me early next week with readings.  -omron series 3 is reasonable  I would like to try a medication called sumatriptan but want to make sure home readings are better before we start this   Team please update vision and let me know if in normal range.    Recommended follow up: Return in about 1 month (around 02/02/2021) for follow up- or sooner if needed. .  - also can schedule next available physical  DASH Eating Plan DASH stands for Dietary Approaches to Stop Hypertension. The DASH eating plan is a healthy eating plan that has been shown to:  Reduce high blood pressure (hypertension).  Reduce your risk for type 2 diabetes, heart disease, and stroke.  Help with weight loss. What are tips for following this plan? Reading food labels  Check food labels for the amount of salt (sodium) per serving. Choose foods with less than 5 percent of the Daily Value of sodium. Generally, foods with less than 300 milligrams (mg) of sodium per serving fit into this eating plan.  To find whole grains, look for the word "whole" as the first word in the ingredient list. Shopping  Buy products labeled as "low-sodium" or "no salt added."  Buy fresh foods. Avoid  canned foods and pre-made or frozen meals. Cooking  Avoid adding salt when cooking. Use salt-free seasonings or herbs instead of table salt or sea salt. Check with your health care provider or pharmacist before using salt substitutes.  Do not fry foods. Cook foods using healthy methods such as baking, boiling, grilling, roasting, and broiling instead.  Cook with heart-healthy oils, such as olive, canola, avocado, soybean, or sunflower oil. Meal planning  Eat a balanced diet that includes: ? 4 or more servings of fruits and 4 or more servings of vegetables each day. Try to fill one-half of your plate with fruits and vegetables. ? 6-8 servings of whole grains each day. ? Less than 6 oz (170 g) of lean meat, poultry, or fish each day. A 3-oz (85-g) serving of meat is about the same size as a deck of cards. One egg equals 1 oz (28 g). ? 2-3 servings of low-fat dairy each day. One serving is 1 cup (237 mL). ? 1 serving of nuts, seeds, or beans 5 times each week. ? 2-3 servings of heart-healthy fats. Healthy fats called omega-3 fatty acids are found in foods such as walnuts, flaxseeds, fortified milks, and eggs. These fats are also found in cold-water fish, such as sardines, salmon, and mackerel.  Limit how much you eat of: ? Canned or prepackaged foods. ? Food  that is high in trans fat, such as some fried foods. ? Food that is high in saturated fat, such as fatty meat. ? Desserts and other sweets, sugary drinks, and other foods with added sugar. ? Full-fat dairy products.  Do not salt foods before eating.  Do not eat more than 4 egg yolks a week.  Try to eat at least 2 vegetarian meals a week.  Eat more home-cooked food and less restaurant, buffet, and fast food.   Lifestyle  When eating at a restaurant, ask that your food be prepared with less salt or no salt, if possible.  If you drink alcohol: ? Limit how much you use to:  0-1 drink a day for women who are not pregnant.  0-2  drinks a day for men. ? Be aware of how much alcohol is in your drink. In the [place] one drink equals one 12 oz bottle of beer (355 mL), one 5 oz glass of wine (148 mL), or one 1 oz glass of hard liquor (44 mL). General information  Avoid eating more than 2,300 mg of salt a day. If you have hypertension, you may need to reduce your sodium intake to 1,500 mg a day.  Work with your health care provider to maintain a healthy body weight or to lose weight. Ask what an ideal weight is for you.  Get at least 30 minutes of exercise that causes your heart to beat faster (aerobic exercise) most days of the week. Activities may include walking, swimming, or biking.  Work with your health care provider or dietitian to adjust your eating plan to your individual calorie needs. What foods should I eat? Fruits All fresh, dried, or frozen fruit. Canned fruit in natural juice (without added sugar). Vegetables Fresh or frozen vegetables (raw, steamed, roasted, or grilled). Low-sodium or reduced-sodium tomato and vegetable juice. Low-sodium or reduced-sodium tomato sauce and tomato paste. Low-sodium or reduced-sodium canned vegetables. Grains Whole-grain or whole-wheat bread. Whole-grain or whole-wheat pasta. Hashman rice. [place]. Bulgur. Whole-grain and low-sodium cereals. Pita bread. Low-fat, low-sodium crackers. Whole-wheat flour tortillas. Meats and other proteins Skinless chicken or [place]. Ground chicken or [place]. Pork with fat trimmed off. Fish and seafood. Egg whites. Dried beans, peas, or lentils. Unsalted nuts, nut butters, and seeds. Unsalted canned beans. Lean cuts of beef with fat trimmed off. Low-sodium, lean precooked or cured meat, such as sausages or meat loaves. Dairy Low-fat (1%) or fat-free (skim) milk. Reduced-fat, low-fat, or fat-free cheeses. Nonfat, low-sodium ricotta or cottage cheese. Low-fat or nonfat yogurt. Low-fat, low-sodium cheese. Fats and oils Soft margarine without  trans fats. Vegetable oil. Reduced-fat, low-fat, or light mayonnaise and salad dressings (reduced-sodium). Canola, safflower, olive, avocado, soybean, and sunflower oils. Avocado. Seasonings and condiments Herbs. Spices. Seasoning mixes without salt. Other foods Unsalted popcorn and pretzels. Fat-free sweets. The items listed above may not be a complete list of foods and beverages you can eat. Contact a dietitian for more information. What foods should I avoid? Fruits Canned fruit in a light or heavy syrup. Fried fruit. Fruit in cream or butter sauce. Vegetables Creamed or fried vegetables. Vegetables in a cheese sauce. Regular canned vegetables (not low-sodium or reduced-sodium). Regular canned tomato sauce and paste (not low-sodium or reduced-sodium). Regular tomato and vegetable juice (not low-sodium or reduced-sodium). Angie Fava. Olives. Grains Baked goods made with fat, such as croissants, muffins, or some breads. Dry pasta or rice meal packs. Meats and other proteins Fatty cuts of meat. Ribs. Fried meat. Berniece Salines. Bologna, [place],  and other precooked or cured meats, such as sausages or meat loaves. Fat from the back of a pig (fatback). Bratwurst. Salted nuts and seeds. Canned beans with added salt. Canned or smoked fish. Whole eggs or egg yolks. Chicken or [place] with skin. Dairy Whole or 2% milk, cream, and half-and-half. Whole or full-fat cream cheese. Whole-fat or sweetened yogurt. Full-fat cheese. Nondairy creamers. Whipped toppings. Processed cheese and cheese spreads. Fats and oils Butter. Stick margarine. Lard. Shortening. Ghee. Bacon fat. Tropical oils, such as coconut, palm kernel, or palm oil. Seasonings and condiments Onion salt, garlic salt, seasoned salt, table salt, and sea salt. Worcestershire sauce. Tartar sauce. Barbecue sauce. Teriyaki sauce. Soy sauce, including reduced-sodium. Steak sauce. Canned and packaged gravies. Fish sauce. Oyster sauce. Cocktail sauce. Store-bought  horseradish. Ketchup. Mustard. Meat flavorings and tenderizers. Bouillon cubes. Hot sauces. Pre-made or packaged marinades. Pre-made or packaged taco seasonings. Relishes. Regular salad dressings. Other foods Salted popcorn and pretzels. The items listed above may not be a complete list of foods and beverages you should avoid. Contact a dietitian for more information. Where to find more information  National Heart, Lung, and Blood Institute: https://wilson-eaton.com/  American Heart Association: www.heart.org  Academy of Nutrition and Dietetics: www.eatright.[place]: www.kidney.org Summary  The DASH eating plan is a healthy eating plan that has been shown to reduce high blood pressure (hypertension). It may also reduce your risk for type 2 diabetes, heart disease, and stroke.  When on the DASH eating plan, aim to eat more fresh fruits and vegetables, whole grains, lean proteins, low-fat dairy, and heart-healthy fats.  With the DASH eating plan, you should limit salt (sodium) intake to 2,300 mg a day. If you have hypertension, you may need to reduce your sodium intake to 1,500 mg a day.  Work with your health care provider or dietitian to adjust your eating plan to your individual calorie needs. This information is not intended to replace advice given to you by your health care provider. Make sure you discuss any questions you have with your health care provider. Document Revised: 07/07/2019 Document Reviewed: 07/07/2019 Elsevier Patient Education  2021 [employer].

## 2021-01-01 NOTE — Progress Notes (Signed)
Phone [phone] In person visit   Subjective:   [name] is a 40 y.o. year old very pleasant female patient who presents for/with See problem oriented charting Chief Complaint  Patient presents with  . Asthma   This visit occurred during the SARS-CoV-2 public health emergency.  Safety protocols were in place, including screening questions prior to the visit, additional usage of staff PPE, and extensive cleaning of exam room while observing appropriate contact time as indicated for disinfecting solutions.   Past Medical History-  Patient Active Problem List   Diagnosis Date Noted  . Dysmenorrhea 09/29/2019    Priority: Medium  . Dysplastic nevi 11/29/2012    Priority: Low  . Polyp of anterior nasal cavity 03/02/2012    Priority: Low  . Atopic dermatitis 11/26/2009    Priority: Low  . [place] DISEASE, CERVICAL 04/19/2008    Priority: Low  . PLANTAR FASCIITIS 04/19/2008    Priority: Low    Medications- reviewed and updated Current Outpatient Medications  Medication Sig Dispense Refill  . albuterol (VENTOLIN HFA) 108 (90 Base) MCG/ACT inhaler Inhale 2 puffs into the lungs every 6 (six) hours as needed for wheezing or shortness of breath. 1 each 0  . ibuprofen (ADVIL,MOTRIN) 100 MG chewable tablet Chew by mouth every 8 (eight) hours as needed.    . Norethin Ace-Eth Estrad-FE (TAYTULLA PO) Take by mouth.     No current facility-administered medications for this visit.     Objective:  BP (!) 168/110   Pulse 97   Temp 98.3 F (36.8 C) (Temporal)   Ht 5\' 8"  (1.727 m)   Wt 226 lb (102.5 kg)   LMP  (LMP Unknown)   SpO2 97%   BMI 34.36 kg/m  Gen: NAD, resting comfortably CV: RRR no murmurs rubs or gallops Lungs: CTAB no crackles, wheeze, rhonchi Ext: no edema Skin: warm, dry Neuro: CN II-XII intact, sensation and reflexes normal throughout, 5/5 muscle strength in bilateral upper and lower extremities. Normal finger to nose. Normal rapid alternating movements. No  pronator drift. Normal romberg. Normal gait.      Assessment and Plan  # Chronic cough S: Patient got sick with covid January 2021 and tested negative in January 2022 but felt very ill (more sick than when she had covid). Tried to get an appointment but was not able to be seen she states. Took about 2 weeks go get better. She states she cannot get over cough. Comes in fits at times and also has wheezing at times. Never diagnosed with asthma bust history of colds going into bronchitis on regular basis. Cough medicine and cough drops help some. Cough meds seem to work the best. - delsym.  A/P: It certainly sounds like patient has postviral cough syndrome but on the other hand want to rule out any abnormalities in the lung-chest x-ray was ordered today.  If this is negative and we can get blood pressure down could consider something like prednisone but do not want to start with that as could raise her blood pressure. She states main trigger are running (can walk ok) and laughing.  - trial albuterol- if albuterol is helpful for these could consider pulmonology consult  # Headaches S:has had headaches since childhood. Went to doctor when she was 89- cut out certain foods and that seemed to help. In the past 5 years has had a daily headache or every other day starting in base of head/neck and then going up most commonly on the right side  of the head and then to right frontal portion and behind eye- sometimes can be left but mostly on the right. Also gets some shoulder pain- runs down into neck and shoulders. Pulsatile, usually one sided, mild nausea at times, lasts over 4 hours, disabling.  No clear trigger prior to headache but can cause blurry vision- not blurry outside of more intense headaches.   Last year discussed with Dr. Runell Gess ob/gyn and was given fiorcet but has not helped. She sees her again in June. Ibuprofen 600mg  up to twice a day- when really bad takes for a few days (2-3 and then usually some  time off) and then just tolerates- sometimes sleeping off help.   A/P: Patient with headaches for many years but worsening over the last 5 years and including some vision changes-we opted to get an MRI of the brain to rule out mass.   Neurological exam reassuring I would also like to update some blood work given she is feeling some fatigue as well and is noting elevated blood pressure as below. - could consider neurology appt -would like to consider sumatriptan but would liek to make sure home BP is better first  #elevated blood pressure reading S: medication: none. No ibuprofen today Home readings #s:  Does not check -has been working on walking more- walking with daughter when she runs.   No chest pain, shortness of breath. No blurry vision outside of when headach epain worsens.  BP Readings from Last 3 Encounters:  01/02/21 (!) 168/110  09/29/19 134/82  04/13/15 145/98  A/P: Patient with significant swelling and blood pressure over the last 16 months-I wonder if this could be contributing to headache pattern.  On the other hand I want to rule out whitecoat hypertension-I recommended she do home monitoring with goal less than 135/85 and update me early next week.  If her blood pressure is in fact high could consider medication for blood pressure which may also help her headaches- high blood pressure could be contributing to headaches or these could be migraines that would respond to blood pressure medications for prevention/prophylaxis  #Hair loss- patient states had some hair loss after covid- worse in the front of her head. Has tried a shampoo as well as minoxidil  - we will check TSH -could refer to dermatology  Recommended follow up: Return in about 1 month (around 02/02/2021) for follow up- or sooner if needed. .  Lab/Order associations:  NOT fasting   ICD-10-CM   1. Chronic cough  R05.3 DG Chest 2 View    CBC with Differential/Platelet    Comprehensive metabolic panel  2. New daily  persistent headache  G44.52 MR Brain Wo Contrast    CBC with Differential/Platelet    Comprehensive metabolic panel  3. Elevated blood pressure reading  R03.0 CBC with Differential/Platelet    Comprehensive metabolic panel    POCT Urinalysis Dipstick (Automated)    TSH    Meds ordered this encounter  Medications  . albuterol (VENTOLIN HFA) 108 (90 Base) MCG/ACT inhaler    Sig: Inhale 2 puffs into the lungs every 6 (six) hours as needed for wheezing or shortness of breath.    Dispense:  1 each    Refill:  0    Time Spent: 35 minutes of total time (2:50 PM- 3:25 PM) was spent on the date of the encounter performing the following actions: chart review prior to seeing the patient, obtaining history, performing a medically necessary exam, counseling on the treatment plan, placing  orders, and documenting in our EHR.   Return precautions advised.  Garret Reddish, MD

## 2021-01-02 ENCOUNTER — Encounter: Payer: Self-pay | Admitting: Family Medicine

## 2021-01-02 ENCOUNTER — Ambulatory Visit (INDEPENDENT_AMBULATORY_CARE_PROVIDER_SITE_OTHER): Payer: No Typology Code available for payment source

## 2021-01-02 ENCOUNTER — Ambulatory Visit: Payer: No Typology Code available for payment source | Admitting: Family Medicine

## 2021-01-02 ENCOUNTER — Other Ambulatory Visit: Payer: Self-pay

## 2021-01-02 VITALS — BP 168/110 | HR 97 | Temp 98.3°F | Ht 68.0 in | Wt 226.0 lb

## 2021-01-02 DIAGNOSIS — Z23 Encounter for immunization: Secondary | ICD-10-CM

## 2021-01-02 DIAGNOSIS — R030 Elevated blood-pressure reading, without diagnosis of hypertension: Secondary | ICD-10-CM

## 2021-01-02 DIAGNOSIS — R053 Chronic cough: Secondary | ICD-10-CM

## 2021-01-02 DIAGNOSIS — G4452 New daily persistent headache (NDPH): Secondary | ICD-10-CM | POA: Diagnosis not present

## 2021-01-02 DIAGNOSIS — Z1159 Encounter for screening for other viral diseases: Secondary | ICD-10-CM | POA: Diagnosis not present

## 2021-01-02 LAB — POC URINALSYSI DIPSTICK (AUTOMATED)
Bilirubin, UA: NEGATIVE
Blood, UA: NEGATIVE
Glucose, UA: NEGATIVE
Ketones, UA: NEGATIVE
Nitrite, UA: NEGATIVE
Protein, UA: NEGATIVE
Spec Grav, UA: 1.015 (ref 1.010–1.025)
Urobilinogen, UA: 0.2 E.U./dL
pH, UA: 7.5 (ref 5.0–8.0)

## 2021-01-02 IMAGING — DX DG CHEST 2V
2 series · 2 of 2 positions shown · non-contrast
Comparison: None.

CLINICAL DATA: Chronic cough for approximately 4 months.

EXAM:
CHEST - 2 VIEW

[chest pa]
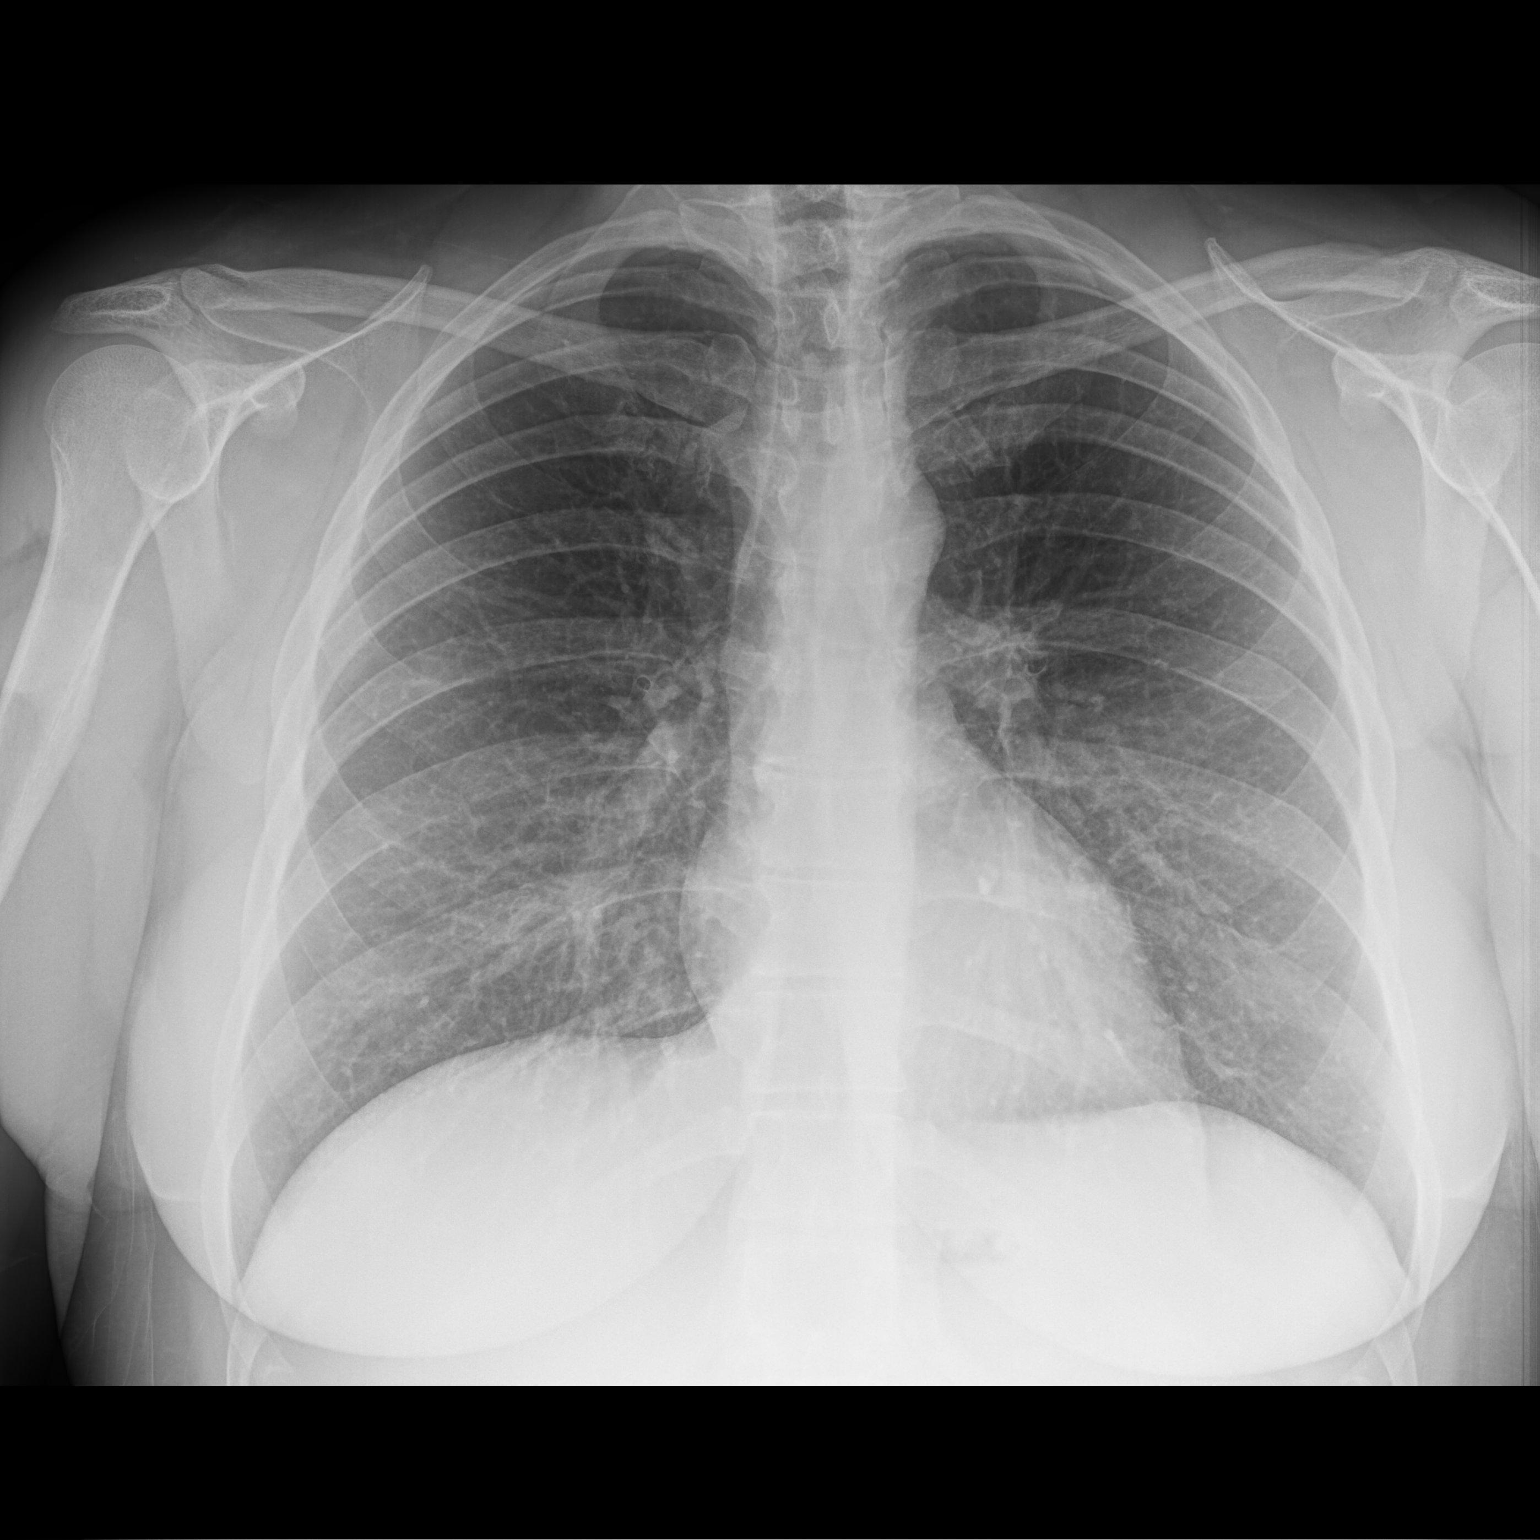

[chest lat]
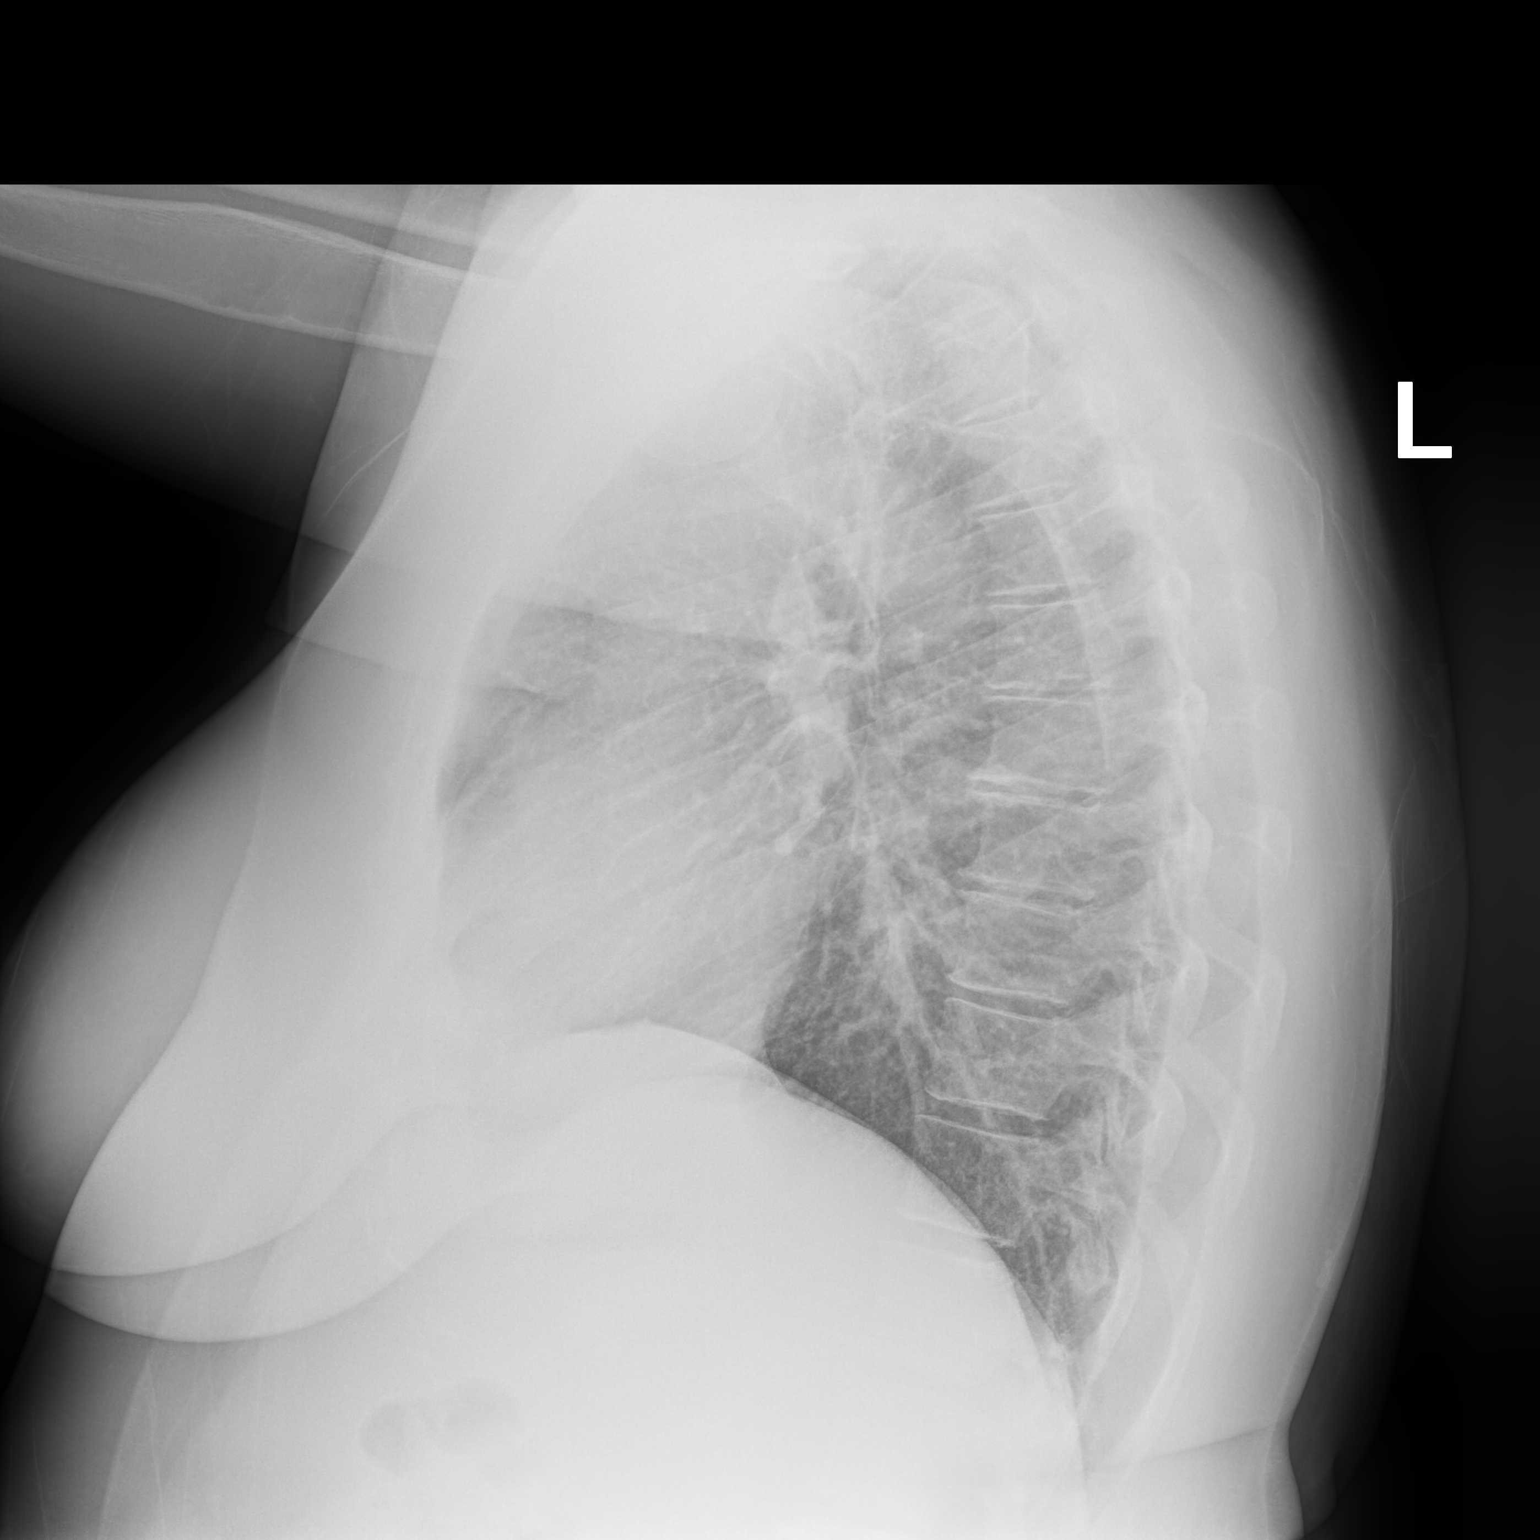

[2 of 2 positions shown; findings below may reference images not displayed]

FINDINGS: The heart size and mediastinal contours are within normal limits.
Both lungs are clear. The visualized skeletal structures are
unremarkable.
IMPRESSION: No active cardiopulmonary disease.

## 2021-01-02 MED ORDER — ALBUTEROL SULFATE HFA 108 (90 BASE) MCG/ACT IN AERS: 2.0000 | INHALATION_SPRAY | Freq: Four times a day (QID) | RESPIRATORY_TRACT | 0 refills | Status: DC | PRN

## 2021-01-02 NOTE — Addendum Note (Signed)
Addended by: Linton Ham on: 01/02/2021 04:08 PM   Modules accepted: Orders

## 2021-01-02 NOTE — Addendum Note (Signed)
Addended by: Brandy Hale on: 01/02/2021 03:51 PM   Modules accepted: Orders

## 2021-01-03 LAB — CBC WITH DIFFERENTIAL/PLATELET
Basophils Absolute: 0.1 10*3/uL (ref 0.0–0.1)
Basophils Relative: 1.4 % (ref 0.0–3.0)
Eosinophils Absolute: 0.1 10*3/uL (ref 0.0–0.7)
Eosinophils Relative: 1.4 % (ref 0.0–5.0)
HCT: 44.2 % (ref 36.0–46.0)
Hemoglobin: 15.3 g/dL — ABNORMAL HIGH (ref 12.0–15.0)
Lymphocytes Relative: 36.8 % (ref 12.0–46.0)
Lymphs Abs: 2.5 10*3/uL (ref 0.7–4.0)
MCHC: 34.7 g/dL (ref 30.0–36.0)
MCV: 89.9 fl (ref 78.0–100.0)
Monocytes Absolute: 0.5 10*3/uL (ref 0.1–1.0)
Monocytes Relative: 8 % (ref 3.0–12.0)
Neutro Abs: 3.5 10*3/uL (ref 1.4–7.7)
Neutrophils Relative %: 52.4 % (ref 43.0–77.0)
Platelets: 254 10*3/uL (ref 150.0–400.0)
RBC: 4.92 Mil/uL (ref 3.87–5.11)
RDW: 12.5 % (ref 11.5–15.5)
WBC: 6.7 10*3/uL (ref 4.0–10.5)

## 2021-01-03 LAB — COMPREHENSIVE METABOLIC PANEL
ALT: 24 U/L (ref 0–35)
AST: 29 U/L (ref 0–37)
Albumin: 4.3 g/dL (ref 3.5–5.2)
Alkaline Phosphatase: 74 U/L (ref 39–117)
BUN: 10 mg/dL (ref 6–23)
CO2: 24 mEq/L (ref 19–32)
Calcium: 9.4 mg/dL (ref 8.4–10.5)
Chloride: 103 mEq/L (ref 96–112)
Creatinine, Ser: 0.69 mg/dL (ref 0.40–1.20)
GFR: 107.49 mL/min (ref >60.00)
Glucose, Bld: 84 mg/dL (ref 70–99)
Potassium: 3.9 mEq/L (ref 3.5–5.1)
Sodium: 137 mEq/L (ref 135–145)
Total Bilirubin: 0.3 mg/dL (ref 0.2–1.2)
Total Protein: 7.7 g/dL (ref 6.0–8.3)

## 2021-01-03 LAB — HEPATITIS C ANTIBODY
Hepatitis C Ab: NONREACTIVE
SIGNAL TO CUT-OFF: 0.25 (ref <1.00)

## 2021-01-03 LAB — TSH: TSH: 3.22 u[IU]/mL (ref 0.35–4.50)

## 2021-01-09 ENCOUNTER — Encounter: Payer: Self-pay | Admitting: Family Medicine

## 2021-01-10 NOTE — Telephone Encounter (Signed)
Left message on voicemail to call office.  

## 2021-01-14 ENCOUNTER — Other Ambulatory Visit: Payer: Self-pay

## 2021-01-14 DIAGNOSIS — L659 Nonscarring hair loss, unspecified: Secondary | ICD-10-CM

## 2021-01-14 MED ORDER — HYDROCHLOROTHIAZIDE 12.5 MG PO CAPS: 12.5000 mg | ORAL_CAPSULE | Freq: Every day | ORAL | 5 refills | Status: DC

## 2021-01-15 ENCOUNTER — Other Ambulatory Visit: Payer: Self-pay

## 2021-01-15 DIAGNOSIS — G4452 New daily persistent headache (NDPH): Secondary | ICD-10-CM

## 2021-01-22 ENCOUNTER — Other Ambulatory Visit: Payer: Self-pay

## 2021-01-22 ENCOUNTER — Ambulatory Visit
Admission: RE | Admit: 2021-01-22 | Discharge: 2021-01-22 | Disposition: A | Discharge status: Home / Self Care | Payer: No Typology Code available for payment source | Source: Ambulatory Visit | Attending: Family Medicine | Admitting: Family Medicine

## 2021-01-22 DIAGNOSIS — G4452 New daily persistent headache (NDPH): Secondary | ICD-10-CM

## 2021-01-22 IMAGING — MR MR HEAD W/O CM
11 series · 48 of 48 positions shown · non-contrast
Comparison: None.

CLINICAL DATA: New daily persistent headache.

EXAM:
MRI HEAD WITHOUT CONTRAST
TECHNIQUE: Multiplanar, multiecho pulse sequences of the brain and surrounding
structures were obtained without intravenous contrast.

[Series 5: T1 · sagittal · 4.0mm · 0.75mm/px · 2 of 31 slices shown (1 of 2)]
[im 1/31]
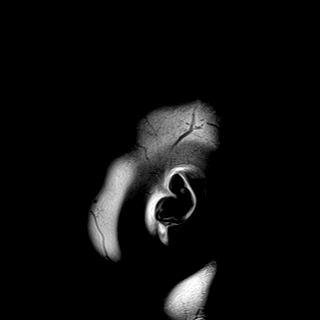
[im 31/31]
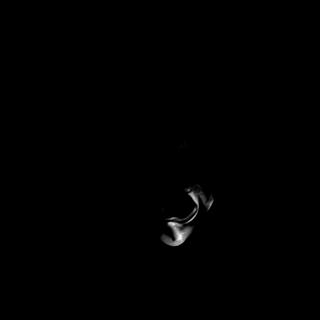

[Series 6: DWI · axial · 3.0mm · 0.94mm/px · z∈[-58,+82]mm · 10 of 160 slices shown (1 of 3)]
[im 1/160]
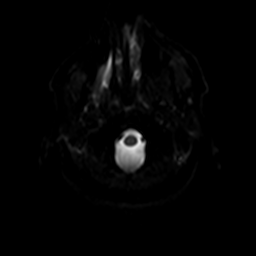
[im 18/160]
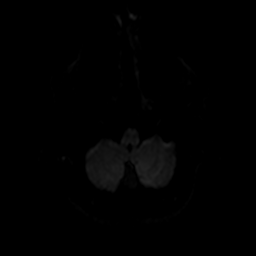
[im 36/160]
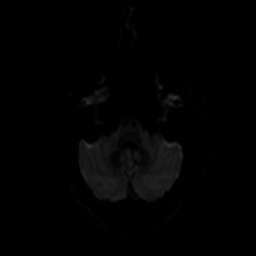
[im 54/160]
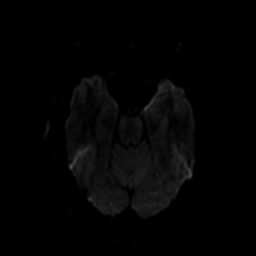
[im 71/160]
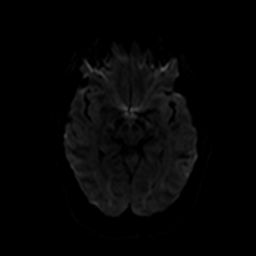
[im 89/160]
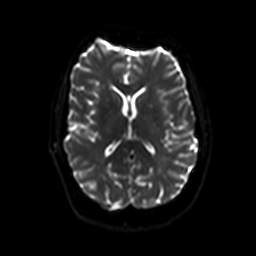
[im 107/160]
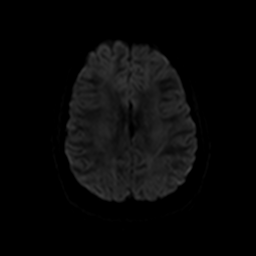
[im 124/160]
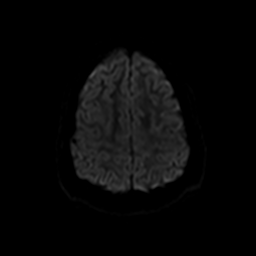
[im 142/160]
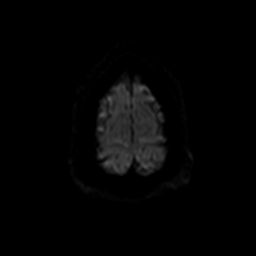
[im 160/160]
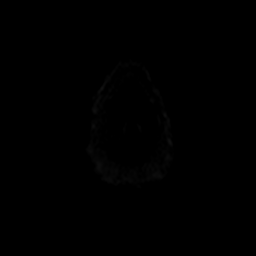

[Series 7: ax dwi_tracew · axial · 3.0mm · 0.94mm/px · z∈[-58,+82]mm · 5 of 80 slices shown]
[im 1/80]
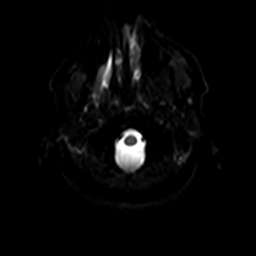
[im 20/80]
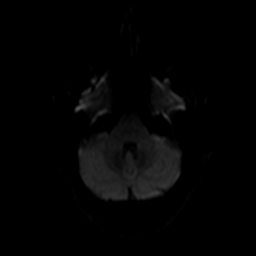
[im 40/80]
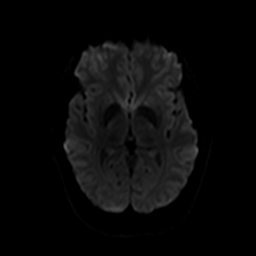
[im 60/80]
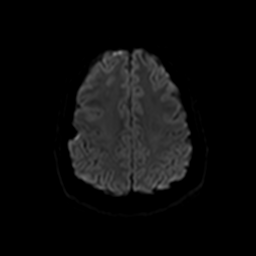
[im 80/80]
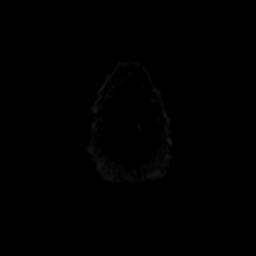

[Series 8: ax dwi_adc · axial · 3.0mm · 0.94mm/px · z∈[-58,+82]mm · 3 of 40 slices shown]
[im 1/40]
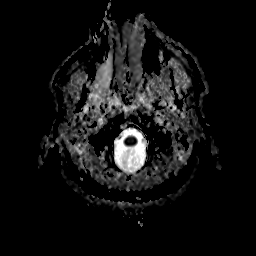
[im 20/40]
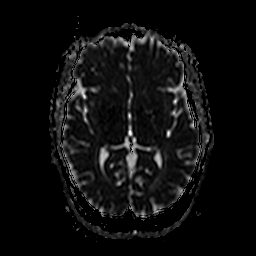
[im 40/40]
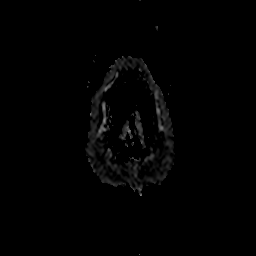

[Series 9: DWI · coronal · 5.0mm · 1.44mm/px · 4 of 64 slices shown (2 of 3)]
[im 1/64]
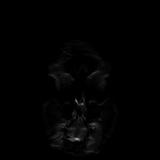
[im 22/64]
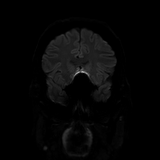
[im 43/64]
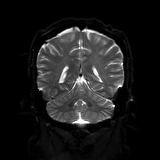
[im 64/64]
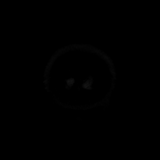

[Series 10: DWI · coronal · 5.0mm · 1.44mm/px · 2 of 32 slices shown (3 of 3)]
[im 1/32]
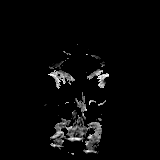
[im 32/32]
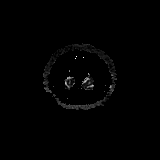

[Series 11: T2 · axial · 4.0mm · 0.36mm/px · z∈[-63,+82]mm · 2 of 29 slices shown (1 of 2)]
[im 1/29]
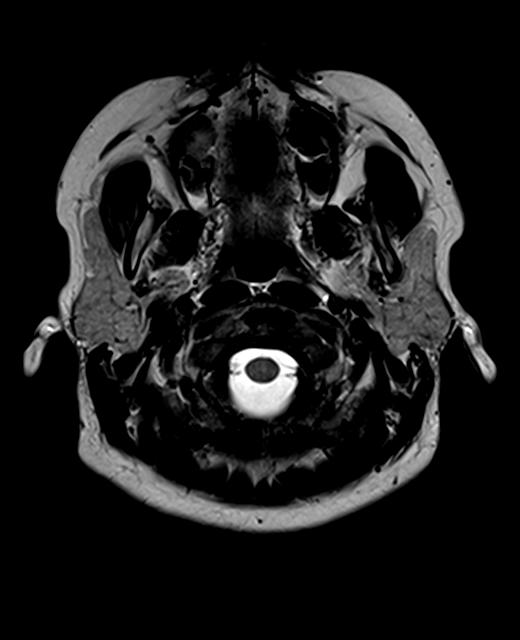
[im 29/29]
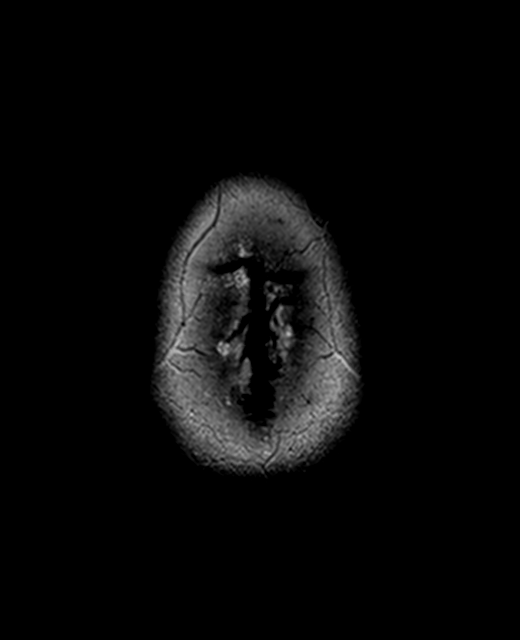

[Series 12: FLAIR · axial · 3.0mm · 0.72mm/px · z∈[-67,+83]mm · 2 of 26 slices shown]
[im 1/26]
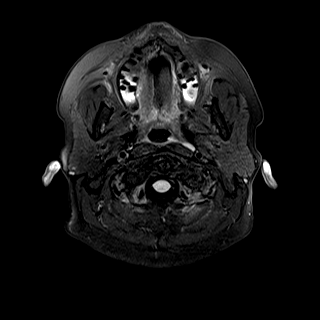
[im 26/26]
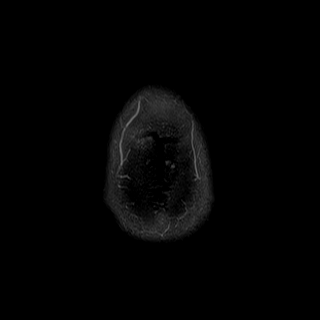

[Series 13: swi_images · axial · 1.5mm · 0.90mm/px · z∈[-62,+81]mm · 6 of 96 slices shown]
[im 1/96]
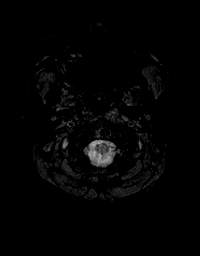
[im 20/96]
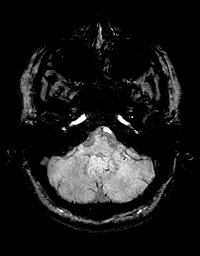
[im 39/96]
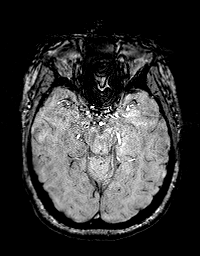
[im 58/96]
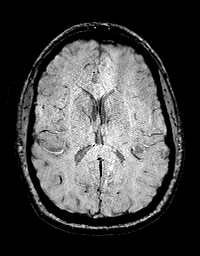
[im 77/96]
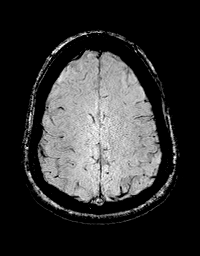
[im 96/96]
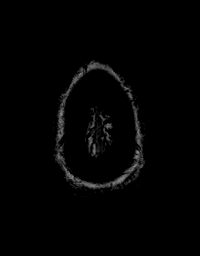

[Series 15: T1 · axial · 1.0mm · 0.94mm/px · z∈[-70,+89]mm · 10 of 160 slices shown (2 of 2)]
[im 1/160]
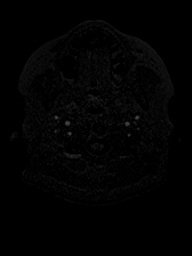
[im 18/160]
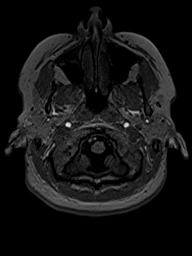
[im 36/160]
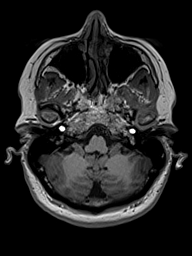
[im 54/160]
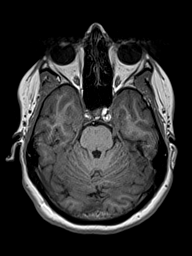
[im 71/160]
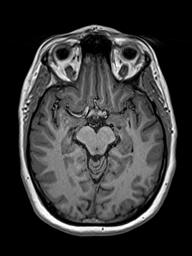
[im 89/160]
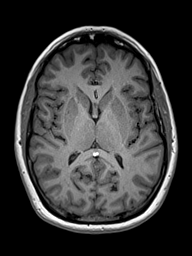
[im 107/160]
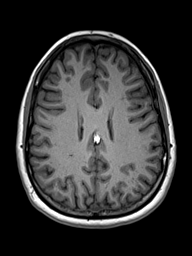
[im 124/160]
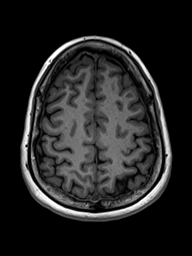
[im 142/160]
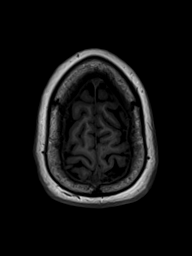
[im 160/160]
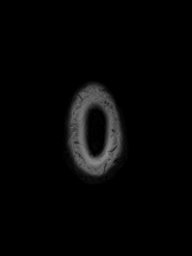

[Series 16: T2 · coronal · 4.5mm · 0.36mm/px · 2 of 30 slices shown (2 of 2)]
[im 1/30]
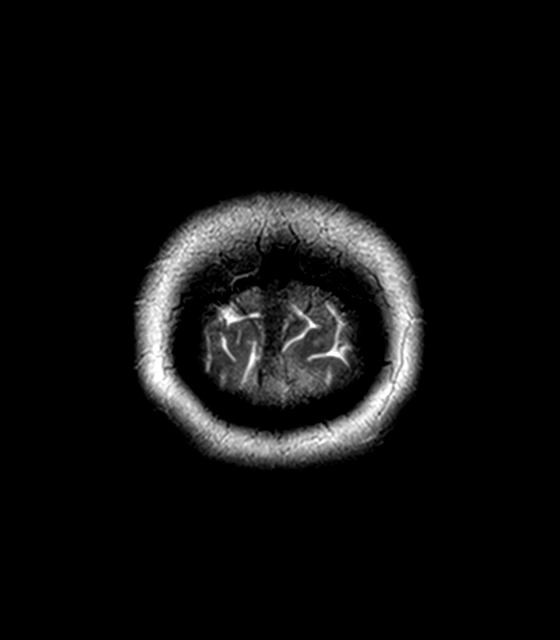
[im 30/30]
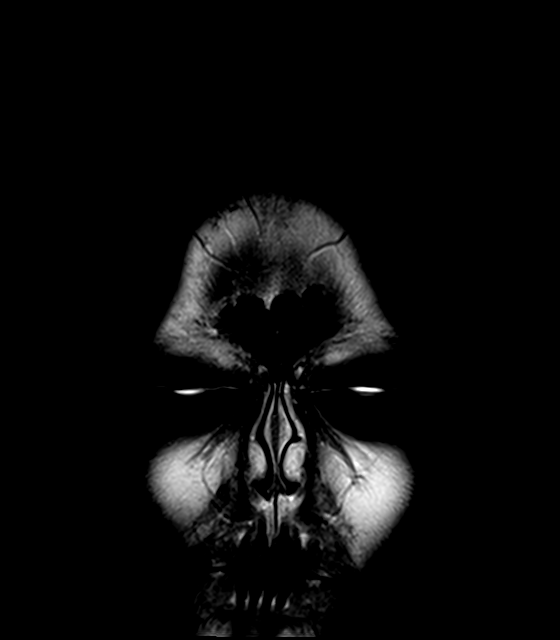

[48 of 48 positions shown; findings below may reference images not displayed]

FINDINGS: Brain: No acute infarction, hemorrhage, hydrocephalus, or
extra-axial collection.

Few scattered foci of T2 hyperintensity are seen within the white
matter of the cerebral hemispheres, nonspecific.

Small dural-based extra-axial lesion in the posterior right frontal
region with low signal on T1 and T2 with susceptibility artifact,
measuring approximately 5 x 7 mm, without significant mass effect on
the brain parenchyma, most likely representing a calcified
meningioma.

Incidentally noted is a small lipoma along the body and splenium of
the corpus callosum.

Vascular: Normal flow voids.

Skull and upper cervical spine: Normal marrow signal.

Sinuses/Orbits: Mucous retention cyst in the right maxillary sinus.
IMPRESSION: 1. Small amount of nonspecific T2 hyperintense lesions of the white
matter. Differential diagnosis include migraines, demyelinating
disease, early microvascular ischemic change and post
inflammatory/infectious process.
2. Small dural-based extra-axial lesion in the right frontal region
likely representing small calcified meningioma.
3. Small lipoma of the corpus callosum.

## 2021-01-27 ENCOUNTER — Emergency Department (HOSPITAL_COMMUNITY)
Admission: EM | Admit: 2021-01-27 | Discharge: 2021-01-28 | Disposition: A | Discharge status: Home / Self Care | Payer: No Typology Code available for payment source | Attending: Emergency Medicine | Admitting: Emergency Medicine

## 2021-01-27 ENCOUNTER — Other Ambulatory Visit: Payer: Self-pay

## 2021-01-27 ENCOUNTER — Telehealth: Payer: Self-pay

## 2021-01-27 ENCOUNTER — Emergency Department (HOSPITAL_COMMUNITY): Payer: No Typology Code available for payment source

## 2021-01-27 DIAGNOSIS — R531 Weakness: Secondary | ICD-10-CM | POA: Diagnosis not present

## 2021-01-27 DIAGNOSIS — R519 Headache, unspecified: Secondary | ICD-10-CM

## 2021-01-27 DIAGNOSIS — M25519 Pain in unspecified shoulder: Secondary | ICD-10-CM | POA: Diagnosis not present

## 2021-01-27 DIAGNOSIS — M79602 Pain in left arm: Secondary | ICD-10-CM | POA: Diagnosis not present

## 2021-01-27 DIAGNOSIS — M542 Cervicalgia: Secondary | ICD-10-CM | POA: Diagnosis not present

## 2021-01-27 LAB — CBC
HCT: 45.7 % (ref 36.0–46.0)
Hemoglobin: 15.8 g/dL — ABNORMAL HIGH (ref 12.0–15.0)
MCH: 31.2 pg (ref 26.0–34.0)
MCHC: 34.6 g/dL (ref 30.0–36.0)
MCV: 90.1 fL (ref 80.0–100.0)
Platelets: 252 10*3/uL (ref 150–400)
RBC: 5.07 MIL/uL (ref 3.87–5.11)
RDW: 11.7 % (ref 11.5–15.5)
WBC: 6.9 10*3/uL (ref 4.0–10.5)
nRBC: 0 % (ref 0.0–0.2)

## 2021-01-27 LAB — BASIC METABOLIC PANEL
Anion gap: 12 (ref 5–15)
BUN: 13 mg/dL (ref 6–20)
CO2: 24 mmol/L (ref 22–32)
Calcium: 9.3 mg/dL (ref 8.9–10.3)
Chloride: 102 mmol/L (ref 98–111)
Creatinine, Ser: 0.84 mg/dL (ref 0.44–1.00)
GFR, Estimated: >60 mL/min (ref >60)
Glucose, Bld: 109 mg/dL — ABNORMAL HIGH (ref 70–99)
Potassium: 3.5 mmol/L (ref 3.5–5.1)
Sodium: 138 mmol/L (ref 135–145)

## 2021-01-27 LAB — TROPONIN I (HIGH SENSITIVITY)
Troponin I (High Sensitivity): 8 ng/L (ref <18)
Troponin I (High Sensitivity): 12 ng/L (ref <18)

## 2021-01-27 IMAGING — CT CT HEAD W/O CM
4 series · 16 of 47 positions shown, 18 images · non-contrast
Comparison: Brain MRI [DATE]

CLINICAL DATA: Headache

EXAM:
CT HEAD WITHOUT CONTRAST
TECHNIQUE: Contiguous axial images were obtained from the base of the skull
through the vertex without intravenous contrast.

[Series 3: head without · axial · non-contrast · 0.44mm/px · z∈[-135,-5]mm · 7 of 36 slices shown, 9 images]
[im 5/36  brain]
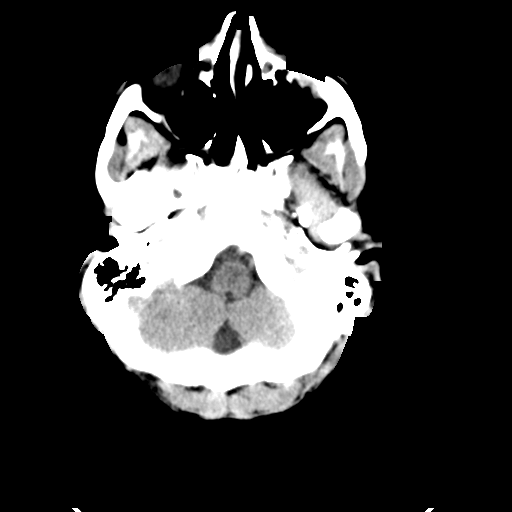
[im 5/36  bone]
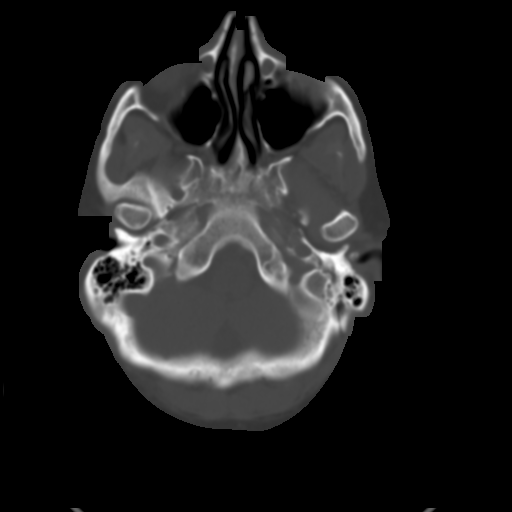
[im 9/36  brain]
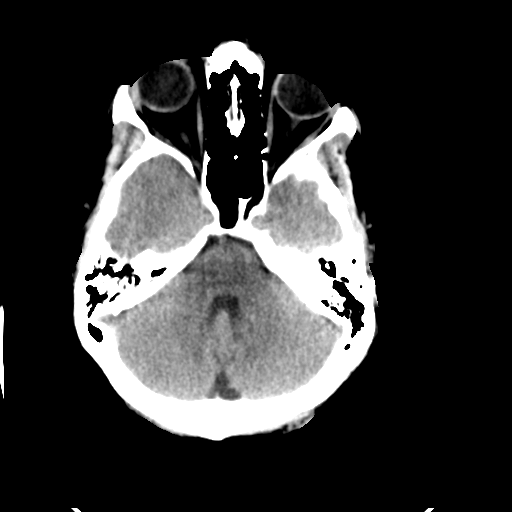
[im 14/36  brain]
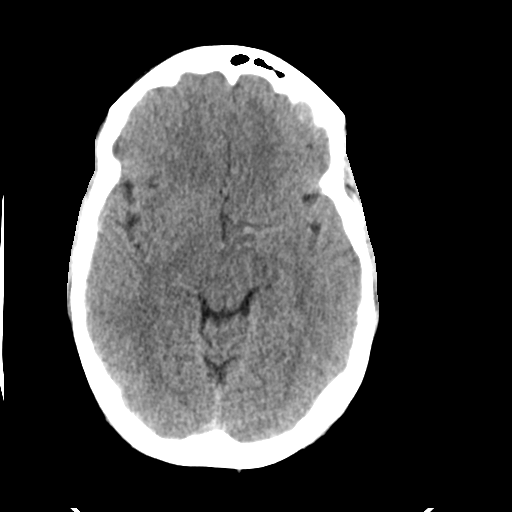
[im 18/36  brain]
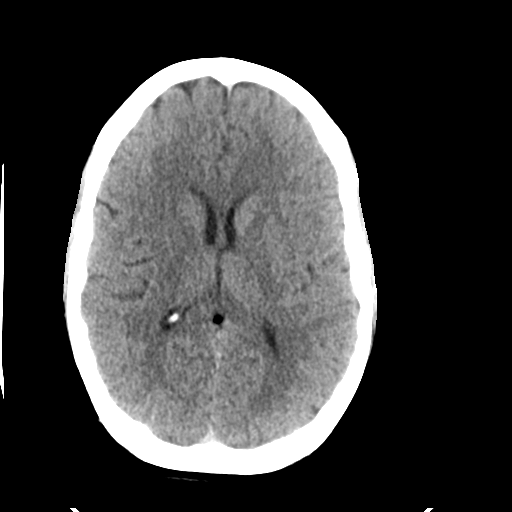
[im 22/36  brain]
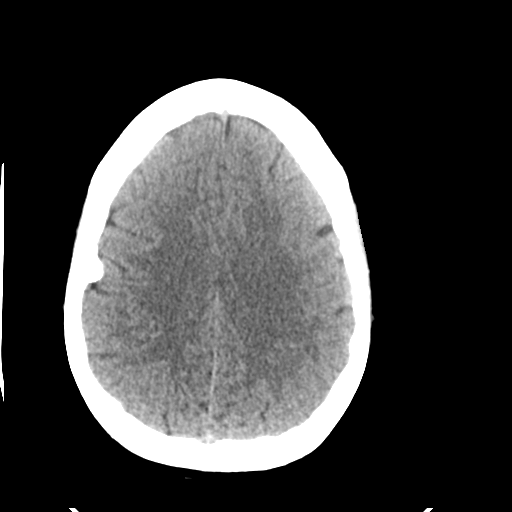
[im 22/36  bone]
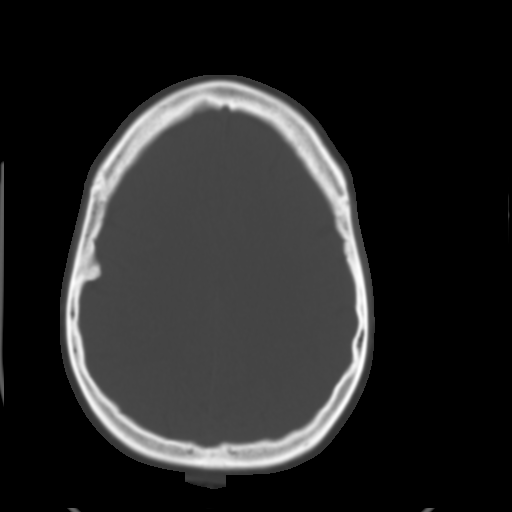
[im 27/36  brain]
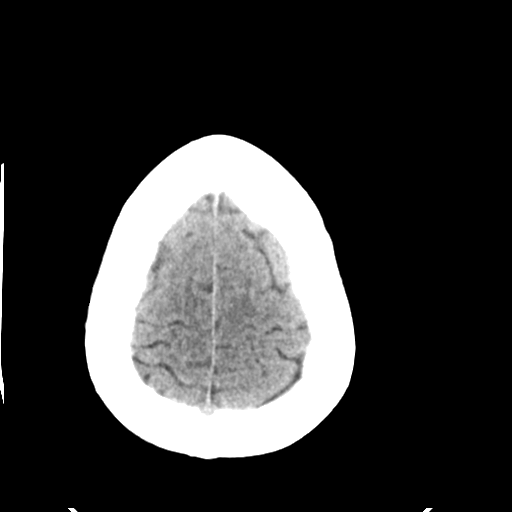
[im 31/36  brain]
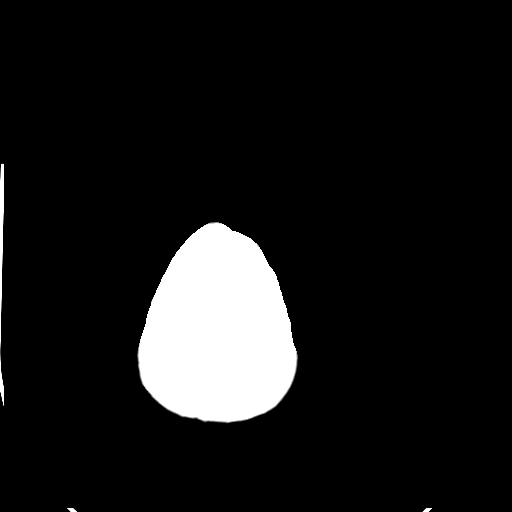

[Series 4: head bone · axial · 0.44mm/px · z∈[-139,-103]mm · 3 of 88 slices shown]
[im 9/88  bone]
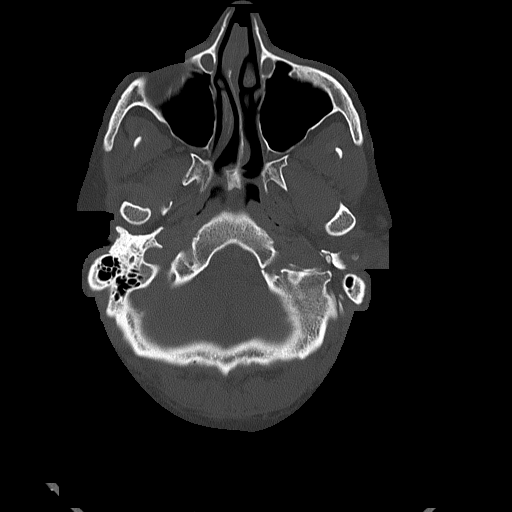
[im 18/88  bone]
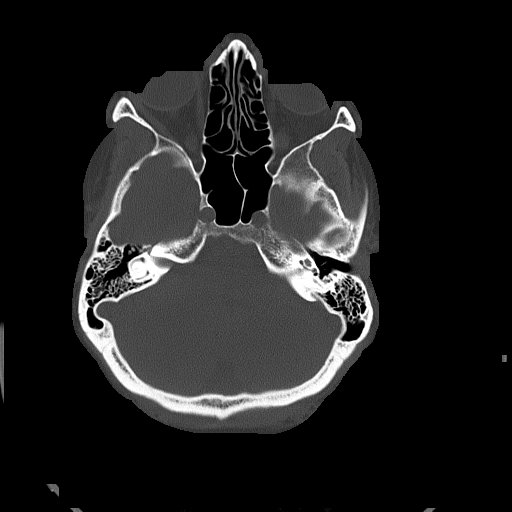
[im 27/88  bone]
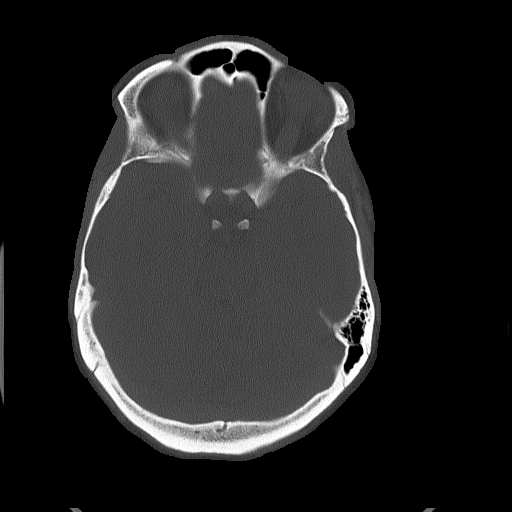

[Series 5: head without cor · coronal · non-contrast · 0.34mm/px · 3 of 65 slices shown]
[im 22/65  brain]
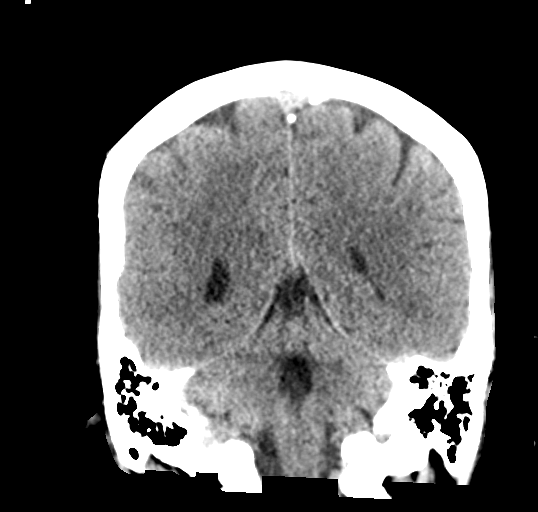
[im 29/65  brain]
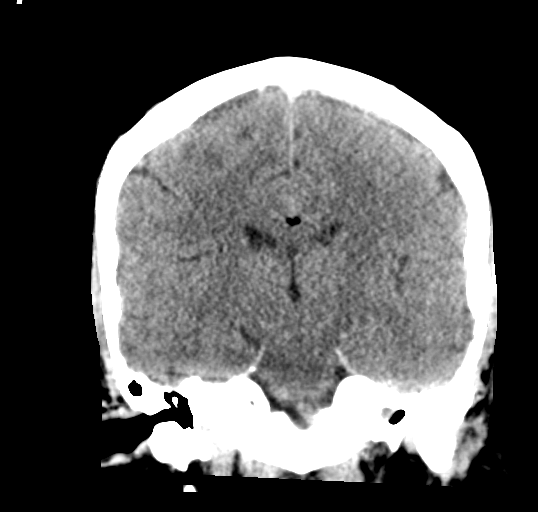
[im 36/65  brain]
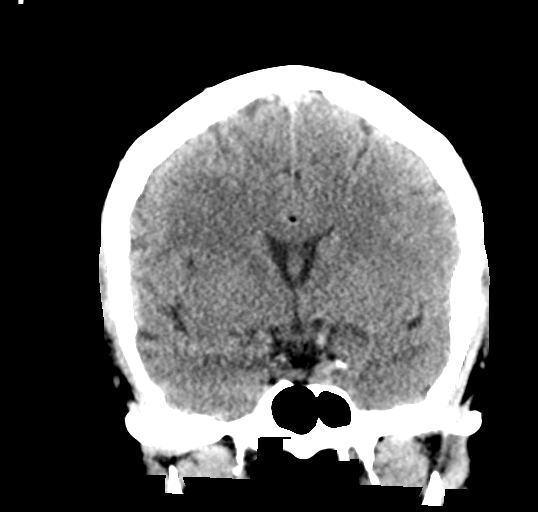

[Series 6: head without sag · sagittal · non-contrast · 0.34mm/px · 3 of 48 slices shown]
[im 16/48  brain]
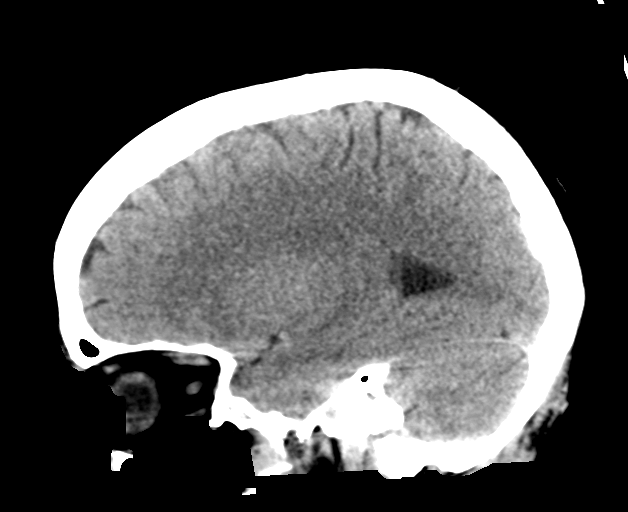
[im 24/48  brain]
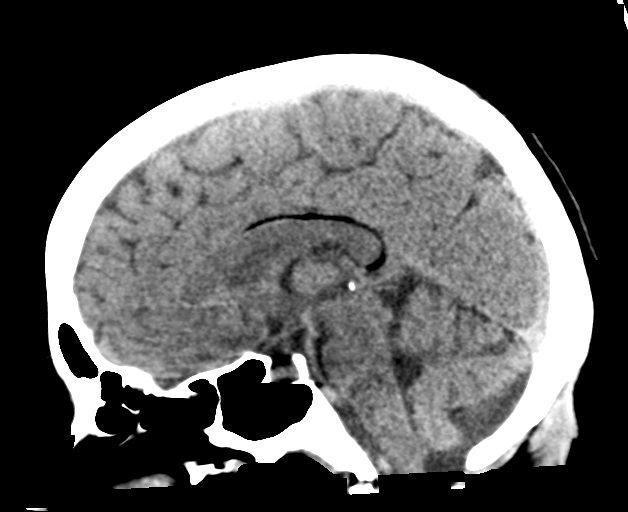
[im 32/48  brain]
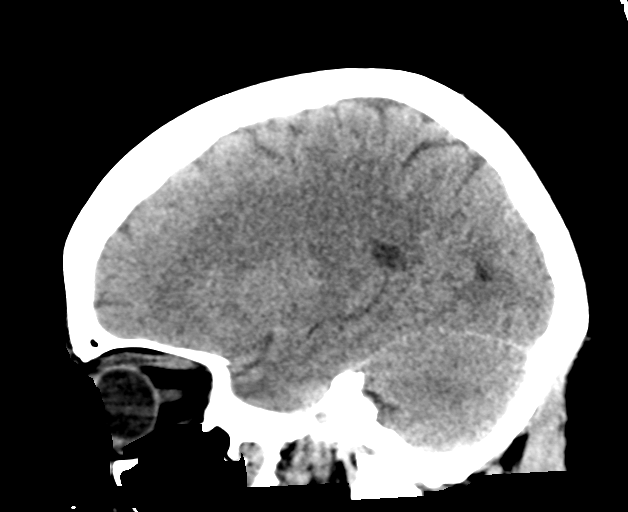

[16 of 47 positions shown; findings below may reference images not displayed]

FINDINGS: Brain: The ventricles and sulci are normal in size and
configuration. Prominence of the cisterna magna is an anatomic
variant. There is an apparent lipoma along the periphery of the mid
and posterior corpus callosum. There is a focus of calcification
along the junction of the frontal and parietal bones measuring 0.8 x
0.8 cm. A probable small enostosis. This area potentially could
represent a small calcified meningioma. No associated mass effect or
edema. No other evident mass. No evident hemorrhage, extra-axial
fluid collection, or midline shift. Brain parenchyma appears
unremarkable by CT without evident acute infarct.

Vascular: No hyperdense vessel. No appreciable vascular
calcification.

Skull: Bony calvarium appears intact. Probable right-sided 8 mm
enostosis as noted above.

Sinuses/Orbits: Paranasal sinuses clear. Orbits appear symmetric
bilaterally.

Other: Mastoid air cells clear.
IMPRESSION: Benign lipoma tracking along the periphery of the mid and posterior
corpus callosum in the midline.

Suspected enostosis measuring 8 x 8 mm near the frontal-parietal
junction. This area conceivably could represent a subcentimeter
meningioma without mass effect or edema. This finding is not felt to
have clinical significance.

3. Brain parenchyma unremarkable without evidence suggesting acute
infarct. No hemorrhage.

## 2021-01-27 NOTE — Telephone Encounter (Signed)
Patient notified to go to ED, she voices understanding

## 2021-01-27 NOTE — Telephone Encounter (Signed)
Patient is having side effects from hydrochlorothiazide (MICROZIDE) 12.5 MG capsule  such as pain in shoulder and numbness in arm and hand. Patient would like to adjust medication or change.

## 2021-01-27 NOTE — Telephone Encounter (Signed)
[place] [facility] [place] [facility] Patient Name: [name] OWN Gender: Female DOB: [birth year] Age: 40 Y 10 M 24 D Return Phone Number: [phone] (Primary) Address: City/ State/ Zip: [place] [address] Client [facility] [employer] [place] [facility] [place] Day Physician Garret Reddish- MD Contact Type Call Who Is Calling Patient / Member / Family / Caregiver Call Type Triage / Clinical Relationship To Patient Self Return Phone Number [phone] (Primary) Chief Complaint NUMBNESS/TINGLING- sudden on one side of the body or face Reason for Call Symptomatic / Request for Health Information Initial Comment Caller states she is experiencing a headache, pain in her shoulder and numbness on her arm yesterday. Caller states she felt like she was having a heart attack. Caller thinks it could be due to her BP medication. Caller was transferred over from the office for triage . [facility] Translation No Nurse Assessment Nurse: Ysidro Evert, RN, Levada Dy Date/Time Eilene Ghazi Time): 01/27/2021 9:27:49 AM Confirm and document reason for call. If symptomatic, describe symptoms. ---Caller states she has a headache that started Saturday. She states she had an episode yesterday when she had pain in her shoulder and it is lingering today Does the patient have any new or worsening symptoms? ---Yes Will a triage be completed? ---Yes Related visit to physician within the last 2 weeks? ---No Does the PT have any chronic conditions? (i.e. diabetes, asthma, this includes High risk factors for pregnancy, etc.) ---Yes List chronic conditions. ---hypertension, migraines Is the patient pregnant or possibly pregnant? (Ask all females between the ages of 15-55) ---No Is this a behavioral health or substance abuse call? ---No PLEASE NOTE: All  timestamps contained within this report are represented as [place] Standard Time. CONFIDENTIALTY NOTICE: This fax transmission is intended only for the addressee. It contains information that is legally privileged, confidential or otherwise protected from use or disclosure. If you are not the intended recipient, you are strictly prohibited from reviewing, disclosing, copying using or disseminating any of this information or taking any action in reliance on or regarding this information. If you have received this fax in error, please notify us immediately by telephone so that we can arrange for its return to [place]. Phone: [phone], Toll-Free: [phone], Fax: [phone] Page: 2 of 2 Call Id: [id number] Guidelines Guideline Title Affirmed Question Affirmed Notes Nurse Date/Time Eilene Ghazi Time) Shoulder Pain [1] Age > 40 AND [2] no obvious cause AND [3] pain even when not moving the arm (Exception: pain is clearly made worse by moving arm or bending neck) Ysidro Evert, RN, Levada Dy 01/27/2021 9:31:01 AM Disp. Time Eilene Ghazi Time) Disposition Final User 01/27/2021 9:25:56 AM Send to Urgent Tobie Lords 01/27/2021 9:35:55 AM Go to ED Now Yes Ysidro Evert, RN, Marin Shutter Disagree/Comply Comply Caller Understands Yes PreDisposition Did not know what to do Care Advice Given Per Guideline GO TO ED NOW: * You need to be seen in the Emergency Department. * Another adult should drive. * Go to the ED at ___________ [place] now. Drive carefully. CALL EMS IF: * The patient passes out, starts acting confused or becomes too weak to stand. CARE ADVICE given per Shoulder Pain (Adult) guideline Referrals GO TO FACILITY OTHER - SPECIF

## 2021-01-27 NOTE — ED Triage Notes (Signed)
Pt sent from PCP for further eval of multiple symptoms: yesterday woke up and developed generalized weakness/like she might faint while at church, had one of the worst headaches of her life with radiation down into neck. Pt having L shoulder pain and numbness, so bad it was causing her to cry. Headache as somewhat subsided but continues to have L shoulder pain, although not as bad as yesterday. Pt recently started taking Hctz.

## 2021-01-28 ENCOUNTER — Emergency Department (HOSPITAL_COMMUNITY): Payer: No Typology Code available for payment source

## 2021-01-28 ENCOUNTER — Encounter: Payer: Self-pay | Admitting: Family Medicine

## 2021-01-28 IMAGING — MR MR MRA NECK WO/W CM
22 of 29 series · 36 of 48 positions shown · IV contrast (gadavist)
Comparison: [DATE] CT head.

CLINICAL DATA: Neuro deficit, acute stroke suspected.  Dizziness.

EXAM:
MRI HEAD WITHOUT AND WITH CONTRAST
MRA HEAD WITHOUT CONTRAST
MRA NECK WITHOUT AND WITH CONTRAST
TECHNIQUE: Multiplanar, multiecho pulse sequences of the brain and surrounding
structures were obtained without and with intravenous contrast.
Angiographic images of the Circle of Willis were obtained using MRA
technique without intravenous contrast. Angiographic images of the
neck were obtained using MRA technique without and with intravenous
contrast. Carotid stenosis measurements (when applicable) are
obtained utilizing NASCET criteria, using the distal internal
carotid diameter as the denominator.
CONTRAST:  10mL GADAVIST GADOBUTROL 1 MMOL/ML IV SOLN

[Series 5: DWI · axial · 3.0mm · 0.88mm/px · z∈[-108,+41]mm · 2 of 102 slices shown (1 of 4)]
[im 1/102]
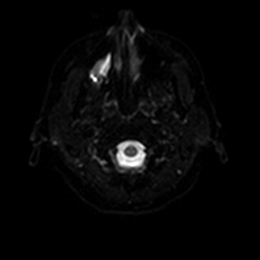
[im 102/102]
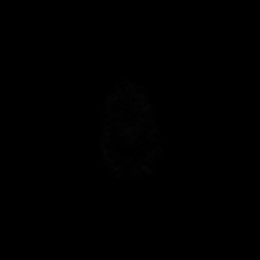

[Series 6: DWI · axial · 3.0mm · 0.88mm/px · 1 of 51 slices shown (2 of 4)]
[im 1/51]
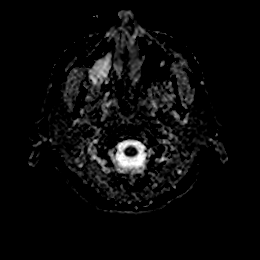

[Series 7: DWI · coronal · 4.0mm · 0.88mm/px · 1 of 64 slices shown (3 of 4)]
[im 1/64]
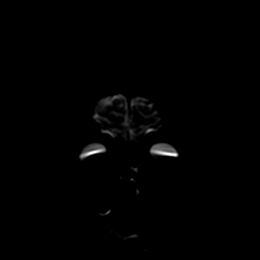

[Series 8: DWI · coronal · 4.0mm · 0.88mm/px · 1 of 32 slices shown (4 of 4)]
[im 1/32]
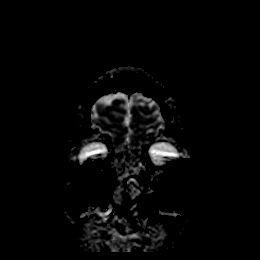

[Series 13: T1 · sagittal · 5.0mm · 0.75mm/px · 1 of 23 slices shown]
[im 1/23]
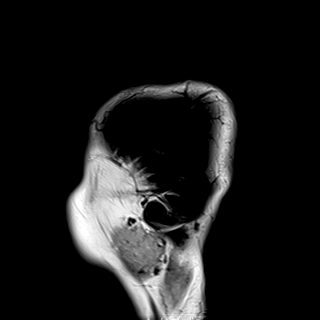

[Series 14: T2 · axial · 5.0mm · 0.72mm/px · 1 of 28 slices shown]
[im 1/28]
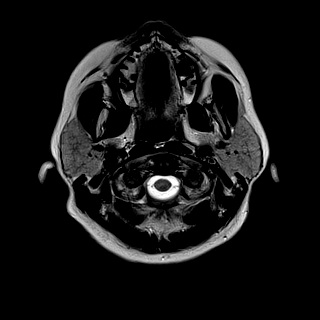

[Series 15: FLAIR · axial · 5.0mm · 0.45mm/px · 1 of 28 slices shown]
[im 1/28]
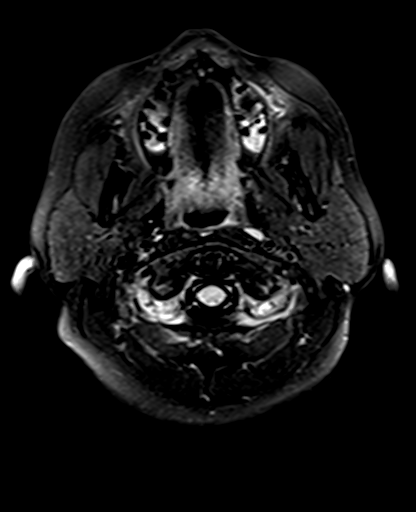

[Series 16: mag_images · axial · 3.0mm · 0.90mm/px · z∈[-125,+49]mm · 2 of 60 slices shown]
[im 1/60]
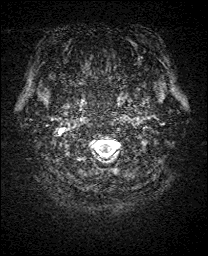
[im 60/60]
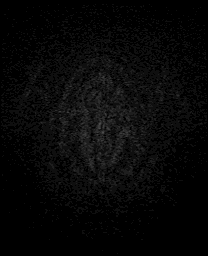

[Series 17: pha_images · axial · 3.0mm · 0.90mm/px · z∈[-125,+40]mm · 2 of 57 slices shown]
[im 1/57]
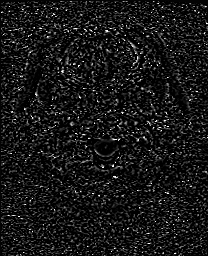
[im 57/57]
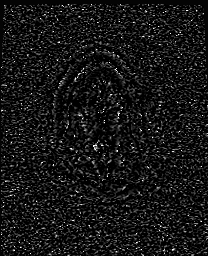

[Series 18: swi_images · axial · 3.0mm · 0.90mm/px · z∈[-125,+49]mm · 2 of 60 slices shown]
[im 1/60]
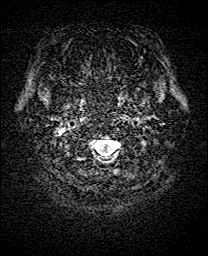
[im 60/60]
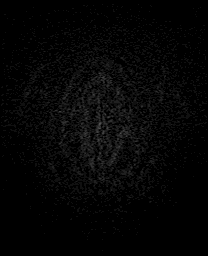

[Series 19: mip_images(sw) · axial · 24.0mm · 0.90mm/px · 1 of 53 slices shown]
[im 1/53]
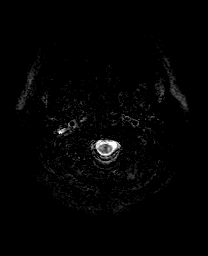

[Series 27: tof_fl3d_tra_iso · axial · 0.6mm · 0.52mm/px · z∈[-186,-108]mm · 4 of 133 slices shown]
[im 1/133]
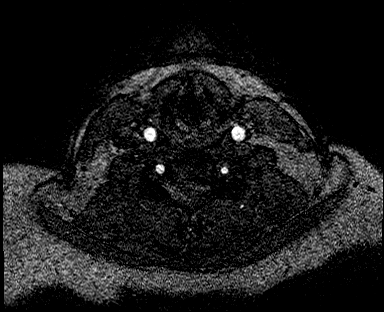
[im 45/133]
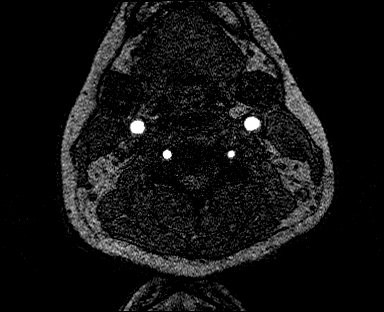
[im 89/133]
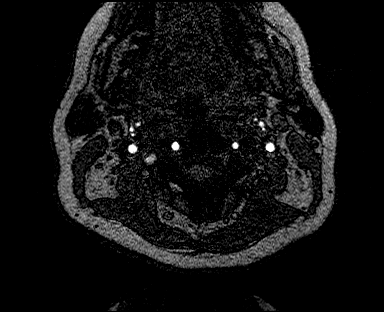
[im 133/133]
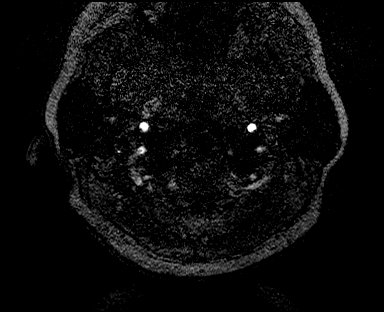

[Series 30: angio_fl3d_cor_pre_ttc=3.0s · coronal · 0.9mm · 0.85mm/px · 2 of 88 slices shown]
[im 1/88]
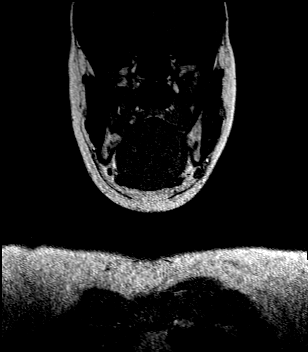
[im 88/88]
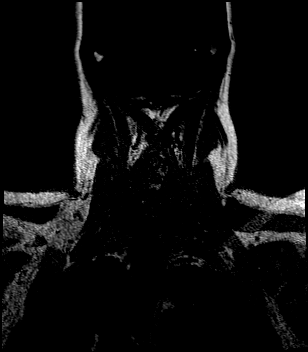

[Series 32: angio_fl3d_cor_post_ttc=3.0s · coronal · 0.9mm · 0.85mm/px · 2 of 88 slices shown (1 of 2)]
[im 1/88]
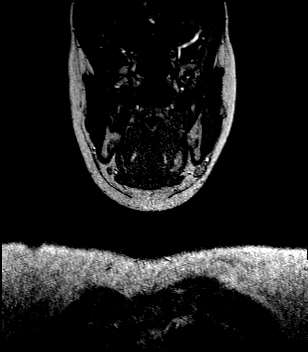
[im 88/88]
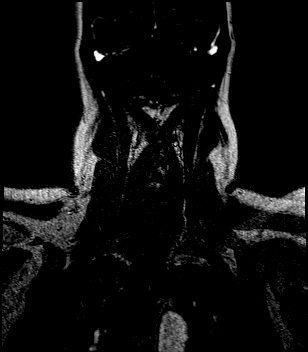

[Series 33: angio_fl3d_cor_post_ttc=3.0s_moco-adv · coronal · 0.9mm · 0.85mm/px · 2 of 88 slices shown (1 of 2)]
[im 1/88]
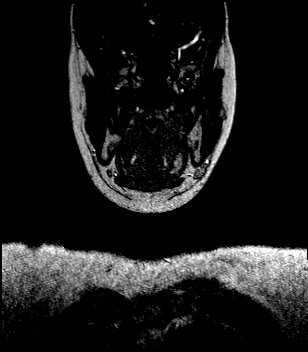
[im 88/88]
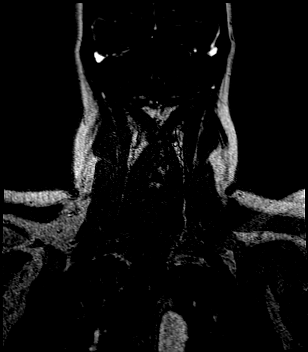

[Series 34: angio_fl3d_cor_post_ttc=3.0s_moco-adv_sub · coronal · 0.9mm · 0.85mm/px · 2 of 86 slices shown (1 of 2)]
[im 1/86]
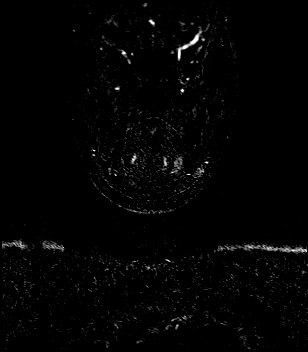
[im 86/86]
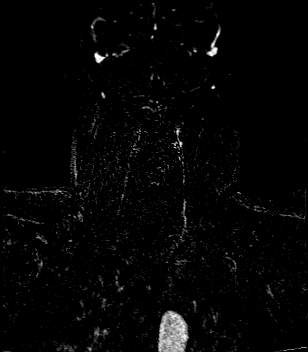

[Series 36: angio_fl3d_cor_post_ttc=3.0s · coronal · 0.9mm · 0.85mm/px · 2 of 88 slices shown (2 of 2)]
[im 1/88]
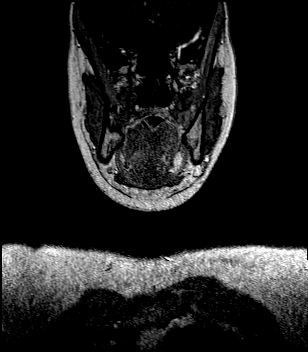
[im 88/88]
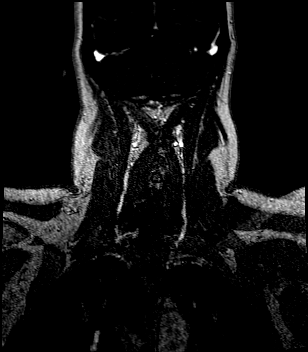

[Series 37: angio_fl3d_cor_post_ttc=3.0s_moco-adv · coronal · 0.9mm · 0.85mm/px · 2 of 88 slices shown (2 of 2)]
[im 1/88]
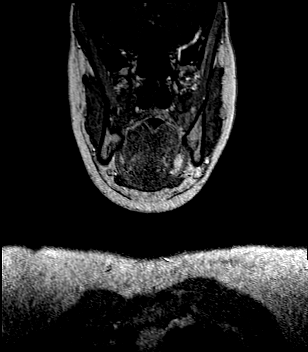
[im 88/88]
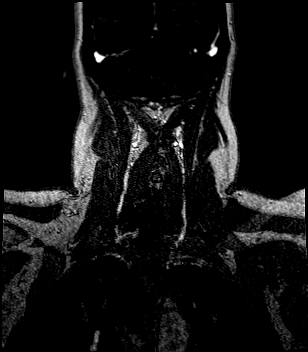

[Series 38: angio_fl3d_cor_post_ttc=3.0s_moco-adv_sub · coronal · 0.9mm · 0.85mm/px · 2 of 88 slices shown (2 of 2)]
[im 1/88]
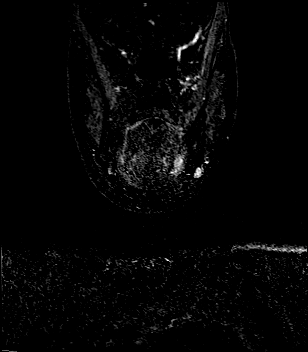
[im 88/88]
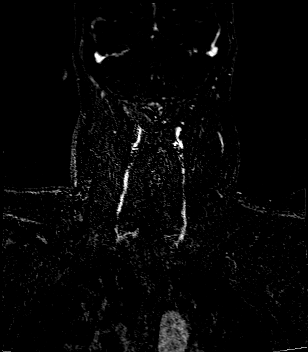

[Series 41: T1 post-contrast · coronal · 5.0mm · 0.34mm/px · 1 of 28 slices shown (1 of 2)]
[im 1/28]
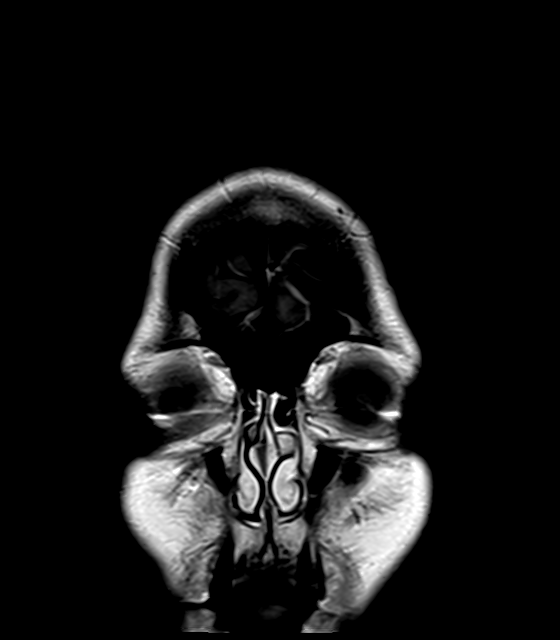

[Series 42: T1 post-contrast · sagittal · 5.0mm · 0.72mm/px · 1 of 23 slices shown (2 of 2)]
[im 1/23]
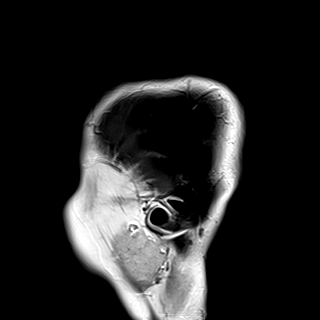

[Series 43: T2 post-contrast · coronal · 5.0mm · 0.72mm/px · 1 of 28 slices shown]
[im 1/28]
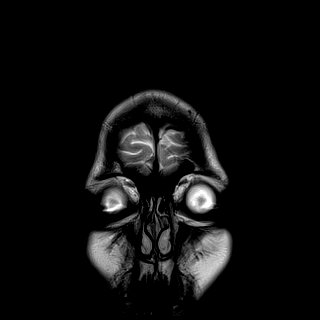

[36 of 48 positions shown; findings below may reference images not displayed]

FINDINGS: MRI HEAD FINDINGS

Brain: No acute infarction, hemorrhage, hydrocephalus, extra-axial
collection or mass lesion. Curvilinear midline T1 hyperintensity
extending along the superior aspect of the corpus callosum,
compatible with pericallosal lipoma. The corpus callosum is
otherwise unremarkable in appearance. Mild scattered T2/FLAIR
hyperintensities within the white matter, most likely related to
chronic microvascular ischemic disease.

Vascular: See below.

Skull and upper cervical spine: The area of bony thickening along
the inner table of the right frontal calvarium seen on same day CT
head does not enhance.

Sinuses/Orbits: Retention cyst in the right maxillary sinus.
Otherwise, clear sinuses. Unremarkable orbits.

Other: No mastoid effusions.

MRA HEAD FINDINGS

Anterior circulation: No large vessel occlusion, proximal
hemodynamically significant stenosis, or aneurysm. Small (1-2 mm)
outpouching arising from the distal left M1 MCA appears to have a
small vessel arising from its tip, compatible with an infundibulum.

Posterior circulation: No large vessel occlusion, proximal
hemodynamically significant stenosis, or aneurysm. Small (1 mm)
conical outpouching arising from the dista left l basilar artery
with a small artery rising from the tip, compatible infundibulum.

MRA NECK FINDINGS

Aorta: Great vessel origins are patent.

Carotid arteries: Patent without evidence of significant (greater
than 50%) stenosis.

Vertebral arteries: Mildly right dominant. Bilateral vertebral
arteries are patent without significant (greater than 50%) stenosis.
IMPRESSION: MRI:

1. No evidence of acute intracranial abnormality. Specifically, no
acute infarct.
2. Pericallosal lipoma.
3. The area of bony thickening along the inner table of the right
frontal calvarium seen on same day CT head does not enhance,
favoring bony hyperostosis over an ossified meningioma. No adjacent
brain edema or substantial mass effect.
4. Mild chronic microvascular ischemic disease.

MRA Head:

No large vessel occlusion or proximal hemodynamically significant
stenosis.

MRA Neck:

No significant (greater than 50%) stenosis.

## 2021-01-28 IMAGING — MR MR HEAD WO/W CM
21 of 26 series · 38 of 48 positions shown · IV contrast (Contrast agent)
Comparison: [DATE] CT head.

CLINICAL DATA: Neuro deficit, acute stroke suspected.  Dizziness.

EXAM:
MRI HEAD WITHOUT AND WITH CONTRAST
MRA HEAD WITHOUT CONTRAST
MRA NECK WITHOUT AND WITH CONTRAST
TECHNIQUE: Multiplanar, multiecho pulse sequences of the brain and surrounding
structures were obtained without and with intravenous contrast.
Angiographic images of the Circle of Willis were obtained using MRA
technique without intravenous contrast. Angiographic images of the
neck were obtained using MRA technique without and with intravenous
contrast. Carotid stenosis measurements (when applicable) are
obtained utilizing NASCET criteria, using the distal internal
carotid diameter as the denominator.
CONTRAST:  10mL GADAVIST GADOBUTROL 1 MMOL/ML IV SOLN

[Series 5: DWI · axial · 3.0mm · 0.88mm/px · z∈[-108,+41]mm · 2 of 102 slices shown (1 of 4)]
[im 1/102]
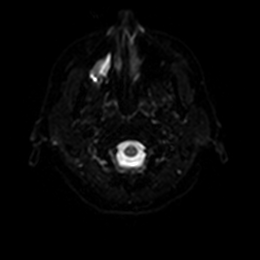
[im 102/102]
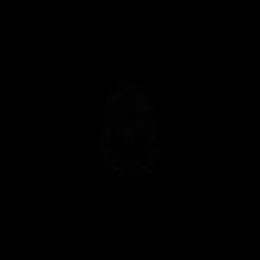

[Series 6: DWI · axial · 3.0mm · 0.88mm/px · 1 of 51 slices shown (2 of 4)]
[im 1/51]
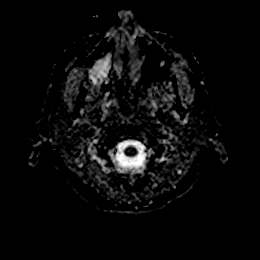

[Series 7: DWI · coronal · 4.0mm · 0.88mm/px · 1 of 64 slices shown (3 of 4)]
[im 1/64]
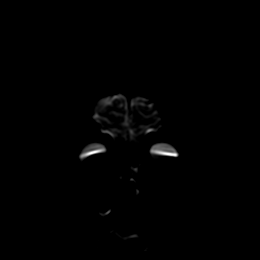

[Series 8: DWI · coronal · 4.0mm · 0.88mm/px · 1 of 32 slices shown (4 of 4)]
[im 1/32]
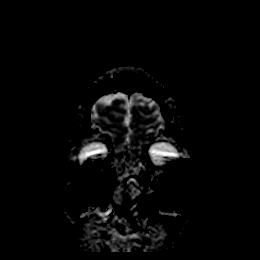

[Series 13: T1 · sagittal · 5.0mm · 0.75mm/px · 1 of 23 slices shown]
[im 1/23]
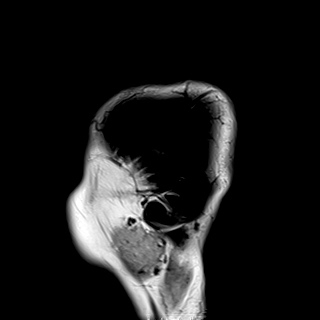

[Series 14: T2 · axial · 5.0mm · 0.72mm/px · 1 of 28 slices shown]
[im 1/28]
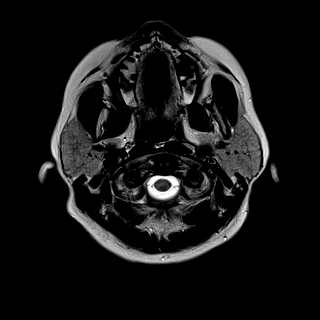

[Series 15: FLAIR · axial · 5.0mm · 0.45mm/px · 1 of 28 slices shown]
[im 1/28]
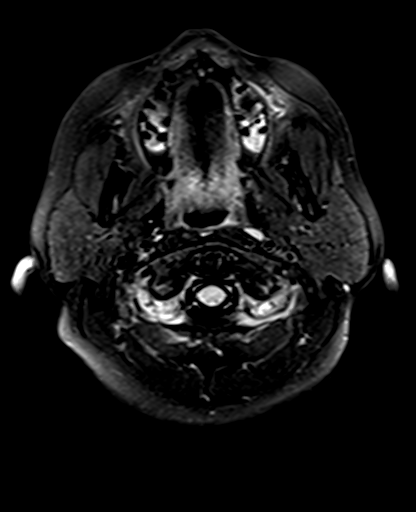

[Series 16: mag_images · axial · 3.0mm · 0.90mm/px · 1 of 60 slices shown]
[im 1/60]
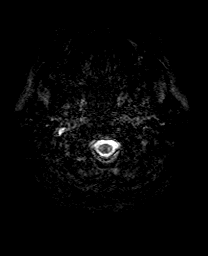

[Series 17: pha_images · axial · 3.0mm · 0.90mm/px · 1 of 57 slices shown]
[im 1/57]
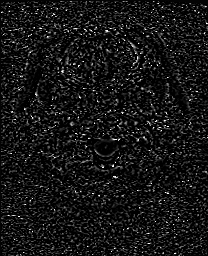

[Series 18: swi_images · axial · 3.0mm · 0.90mm/px · z∈[-125,+49]mm · 2 of 60 slices shown]
[im 1/60]
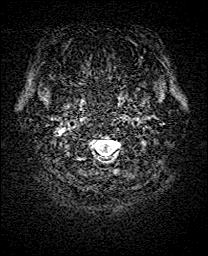
[im 60/60]
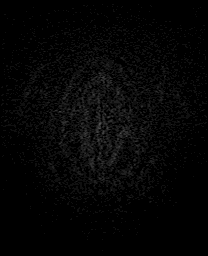

[Series 19: mip_images(sw) · axial · 24.0mm · 0.90mm/px · z∈[-115,+39]mm · 2 of 53 slices shown]
[im 1/53]
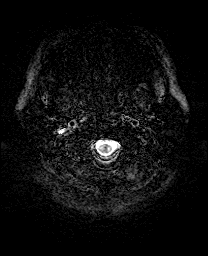
[im 53/53]
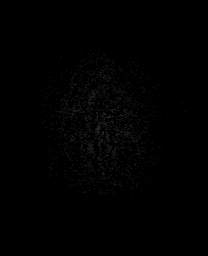

[Series 30: angio_fl3d_cor_pre_ttc=3.0s · coronal · 0.9mm · 0.85mm/px · 3 of 88 slices shown]
[im 1/88]
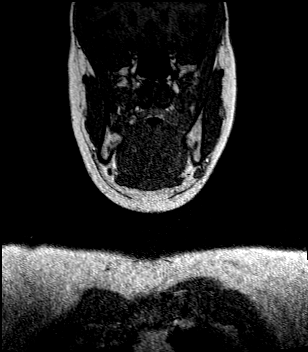
[im 44/88]
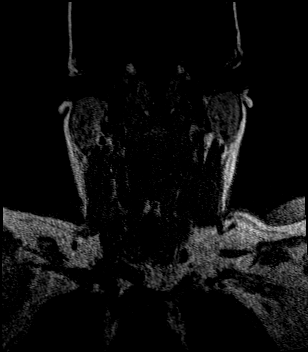
[im 88/88]
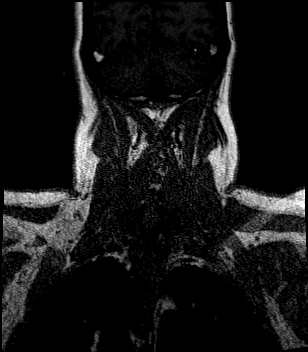

[Series 32: angio_fl3d_cor_post_ttc=3.0s · coronal · 0.9mm · 0.85mm/px · 3 of 88 slices shown (1 of 2)]
[im 1/88]
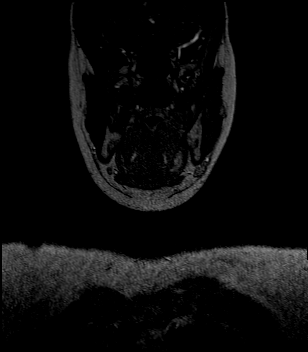
[im 44/88]
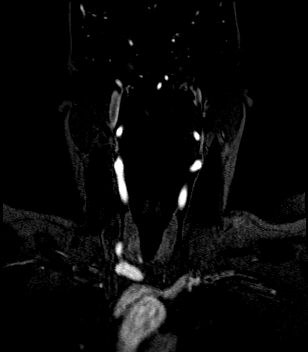
[im 88/88]
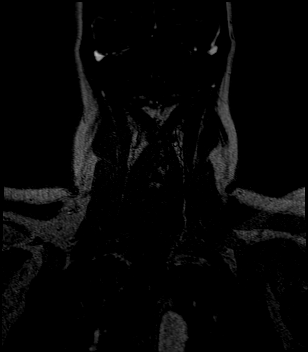

[Series 33: angio_fl3d_cor_post_ttc=3.0s_moco-adv · coronal · 0.9mm · 0.85mm/px · 3 of 88 slices shown (1 of 2)]
[im 1/88]
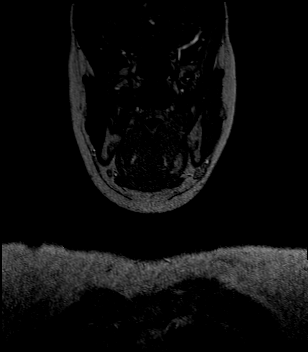
[im 44/88]
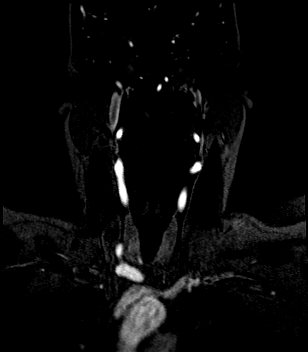
[im 88/88]
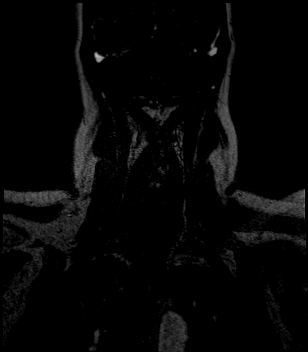

[Series 34: angio_fl3d_cor_post_ttc=3.0s_moco-adv_sub · coronal · 0.9mm · 0.85mm/px · 3 of 86 slices shown (1 of 2)]
[im 1/86]
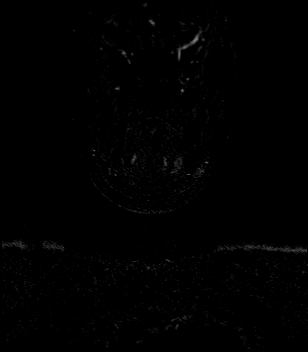
[im 43/86]
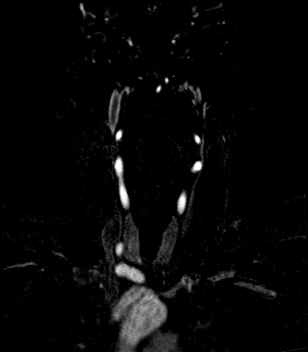
[im 86/86]
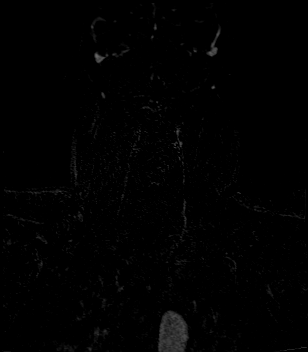

[Series 36: angio_fl3d_cor_post_ttc=3.0s · coronal · 0.9mm · 0.85mm/px · 3 of 88 slices shown (2 of 2)]
[im 1/88]
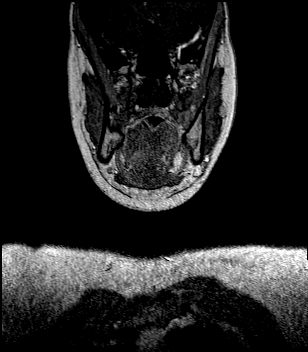
[im 44/88]
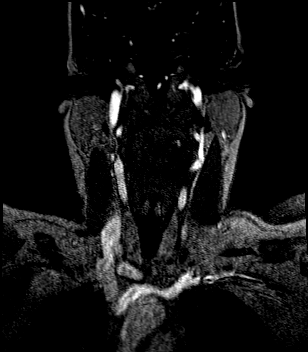
[im 88/88]
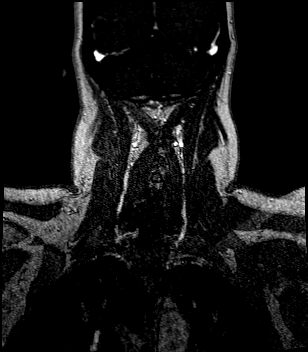

[Series 37: angio_fl3d_cor_post_ttc=3.0s_moco-adv · coronal · 0.9mm · 0.85mm/px · 3 of 88 slices shown (2 of 2)]
[im 1/88]
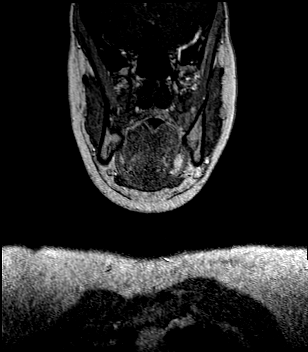
[im 44/88]
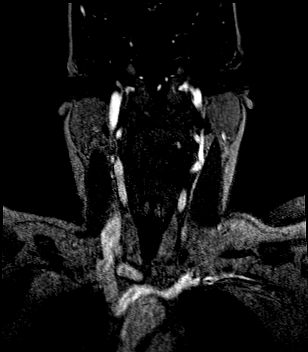
[im 88/88]
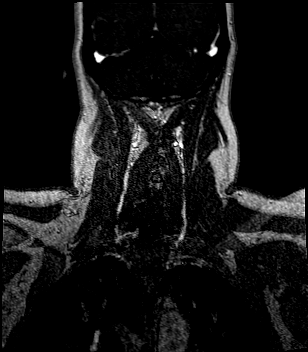

[Series 38: angio_fl3d_cor_post_ttc=3.0s_moco-adv_sub · coronal · 0.9mm · 0.85mm/px · 3 of 88 slices shown (2 of 2)]
[im 1/88]
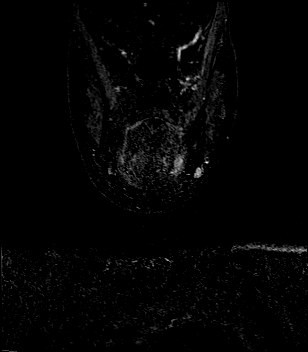
[im 44/88]
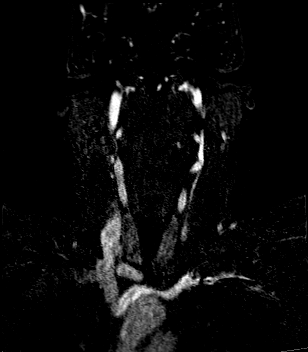
[im 88/88]
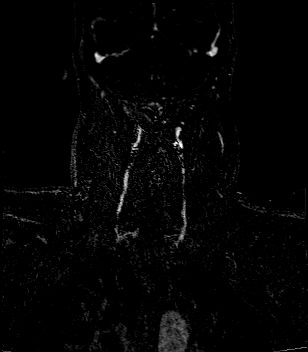

[Series 41: T1 post-contrast · coronal · 5.0mm · 0.34mm/px · 1 of 28 slices shown (1 of 2)]
[im 1/28]
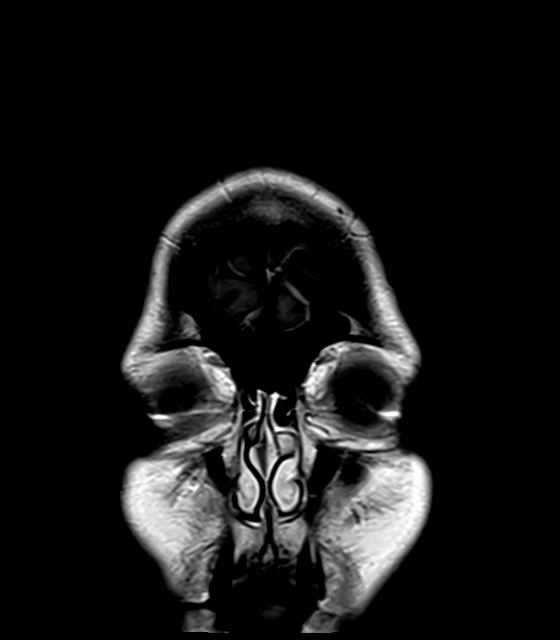

[Series 42: T1 post-contrast · sagittal · 5.0mm · 0.72mm/px · 1 of 23 slices shown (2 of 2)]
[im 1/23]
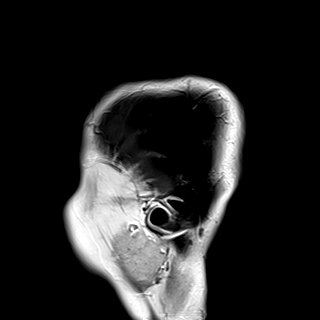

[Series 43: T2 post-contrast · coronal · 5.0mm · 0.72mm/px · 1 of 28 slices shown]
[im 1/28]
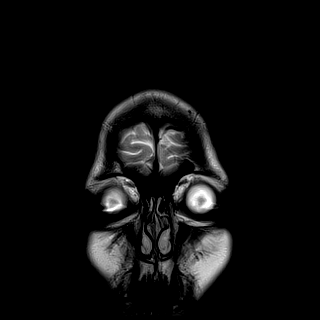

[38 of 48 positions shown; findings below may reference images not displayed]

FINDINGS: MRI HEAD FINDINGS

Brain: No acute infarction, hemorrhage, hydrocephalus, extra-axial
collection or mass lesion. Curvilinear midline T1 hyperintensity
extending along the superior aspect of the corpus callosum,
compatible with pericallosal lipoma. The corpus callosum is
otherwise unremarkable in appearance. Mild scattered T2/FLAIR
hyperintensities within the white matter, most likely related to
chronic microvascular ischemic disease.

Vascular: See below.

Skull and upper cervical spine: The area of bony thickening along
the inner table of the right frontal calvarium seen on same day CT
head does not enhance.

Sinuses/Orbits: Retention cyst in the right maxillary sinus.
Otherwise, clear sinuses. Unremarkable orbits.

Other: No mastoid effusions.

MRA HEAD FINDINGS

Anterior circulation: No large vessel occlusion, proximal
hemodynamically significant stenosis, or aneurysm. Small (1-2 mm)
outpouching arising from the distal left M1 MCA appears to have a
small vessel arising from its tip, compatible with an infundibulum.

Posterior circulation: No large vessel occlusion, proximal
hemodynamically significant stenosis, or aneurysm. Small (1 mm)
conical outpouching arising from the dista left l basilar artery
with a small artery rising from the tip, compatible infundibulum.

MRA NECK FINDINGS

Aorta: Great vessel origins are patent.

Carotid arteries: Patent without evidence of significant (greater
than 50%) stenosis.

Vertebral arteries: Mildly right dominant. Bilateral vertebral
arteries are patent without significant (greater than 50%) stenosis.
IMPRESSION: MRI:

1. No evidence of acute intracranial abnormality. Specifically, no
acute infarct.
2. Pericallosal lipoma.
3. The area of bony thickening along the inner table of the right
frontal calvarium seen on same day CT head does not enhance,
favoring bony hyperostosis over an ossified meningioma. No adjacent
brain edema or substantial mass effect.
4. Mild chronic microvascular ischemic disease.

MRA Head:

No large vessel occlusion or proximal hemodynamically significant
stenosis.

MRA Neck:

No significant (greater than 50%) stenosis.

## 2021-01-28 IMAGING — MR MR MRA HEAD W/O CM
22 of 29 series · 36 of 48 positions shown · IV contrast (Contrast agent)
Comparison: [DATE] CT head.

CLINICAL DATA: Neuro deficit, acute stroke suspected.  Dizziness.

EXAM:
MRI HEAD WITHOUT AND WITH CONTRAST
MRA HEAD WITHOUT CONTRAST
MRA NECK WITHOUT AND WITH CONTRAST
TECHNIQUE: Multiplanar, multiecho pulse sequences of the brain and surrounding
structures were obtained without and with intravenous contrast.
Angiographic images of the Circle of Willis were obtained using MRA
technique without intravenous contrast. Angiographic images of the
neck were obtained using MRA technique without and with intravenous
contrast. Carotid stenosis measurements (when applicable) are
obtained utilizing NASCET criteria, using the distal internal
carotid diameter as the denominator.
CONTRAST:  10mL GADAVIST GADOBUTROL 1 MMOL/ML IV SOLN

[Series 5: DWI · axial · 3.0mm · 0.88mm/px · z∈[-108,+41]mm · 2 of 102 slices shown (1 of 4)]
[im 1/102]
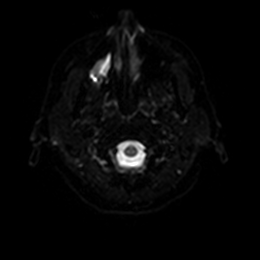
[im 102/102]
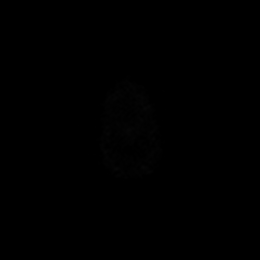

[Series 6: DWI · axial · 3.0mm · 0.88mm/px · 1 of 51 slices shown (2 of 4)]
[im 1/51]
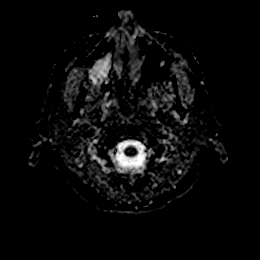

[Series 7: DWI · coronal · 4.0mm · 0.88mm/px · 1 of 64 slices shown (3 of 4)]
[im 1/64]
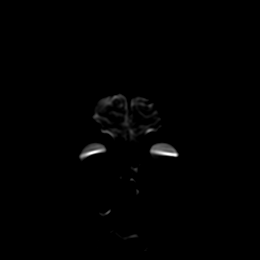

[Series 8: DWI · coronal · 4.0mm · 0.88mm/px · 1 of 32 slices shown (4 of 4)]
[im 1/32]
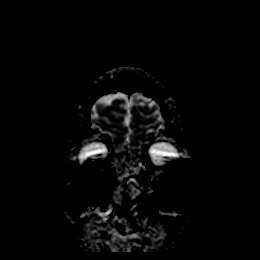

[Series 13: T1 · sagittal · 5.0mm · 0.75mm/px · 1 of 23 slices shown]
[im 1/23]
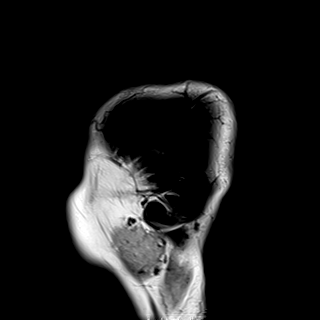

[Series 14: T2 · axial · 5.0mm · 0.72mm/px · 1 of 28 slices shown]
[im 1/28]
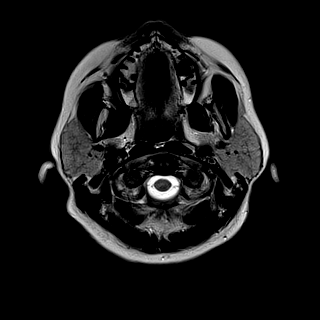

[Series 15: FLAIR · axial · 5.0mm · 0.45mm/px · 1 of 28 slices shown]
[im 1/28]
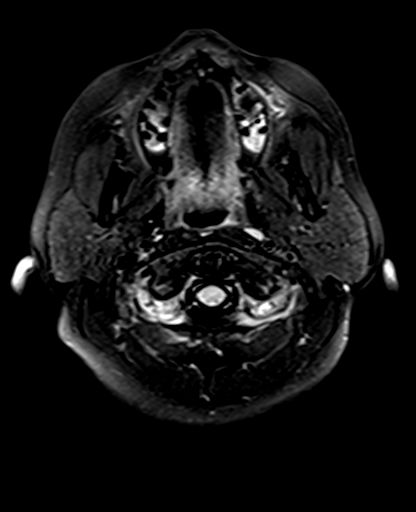

[Series 16: mag_images · axial · 3.0mm · 0.90mm/px · z∈[-125,+49]mm · 2 of 60 slices shown]
[im 1/60]
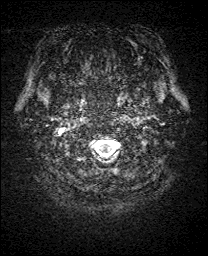
[im 60/60]
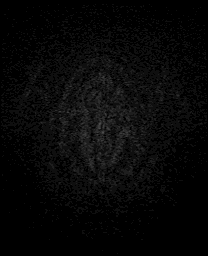

[Series 17: pha_images · axial · 3.0mm · 0.90mm/px · z∈[-125,+40]mm · 2 of 57 slices shown]
[im 1/57]
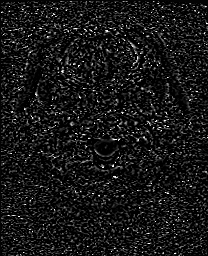
[im 57/57]
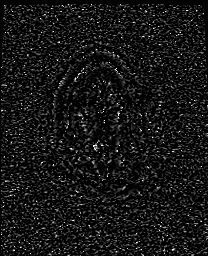

[Series 18: swi_images · axial · 3.0mm · 0.90mm/px · z∈[-125,+49]mm · 2 of 60 slices shown]
[im 1/60]
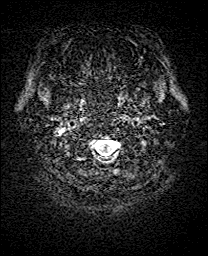
[im 60/60]
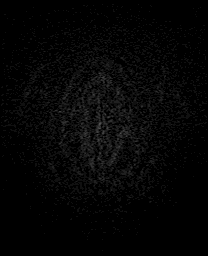

[Series 19: mip_images(sw) · axial · 24.0mm · 0.90mm/px · 1 of 53 slices shown]
[im 1/53]
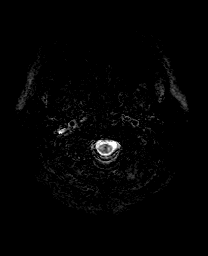

[Series 27: tof_fl3d_tra_iso · axial · 0.6mm · 0.52mm/px · z∈[-186,-108]mm · 4 of 133 slices shown]
[im 1/133]
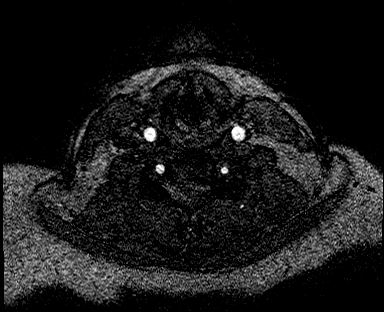
[im 45/133]
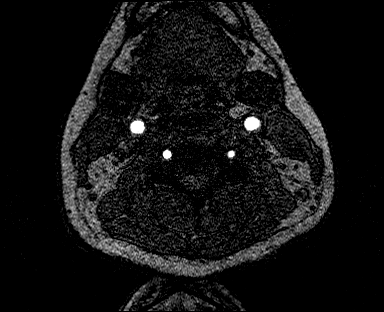
[im 89/133]
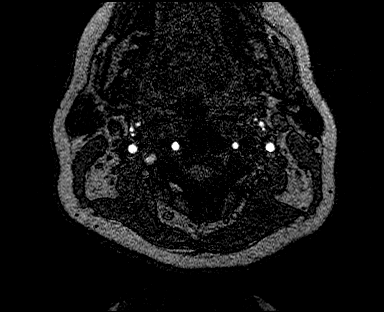
[im 133/133]
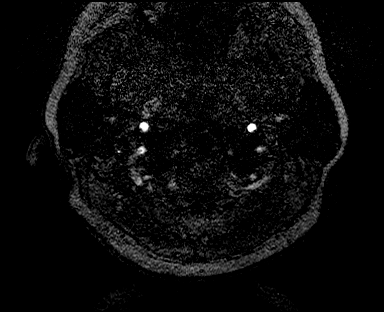

[Series 30: angio_fl3d_cor_pre_ttc=3.0s · coronal · 0.9mm · 0.85mm/px · 2 of 88 slices shown]
[im 1/88]
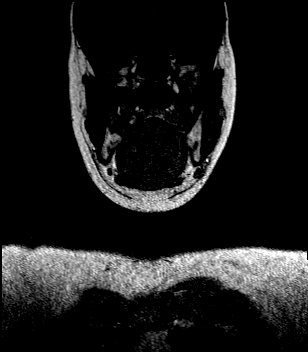
[im 88/88]
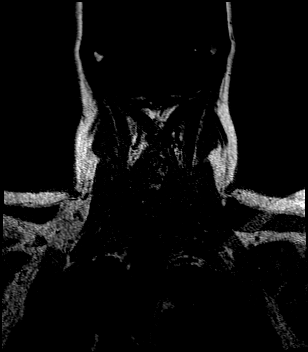

[Series 32: angio_fl3d_cor_post_ttc=3.0s · coronal · 0.9mm · 0.85mm/px · 2 of 88 slices shown (1 of 2)]
[im 1/88]
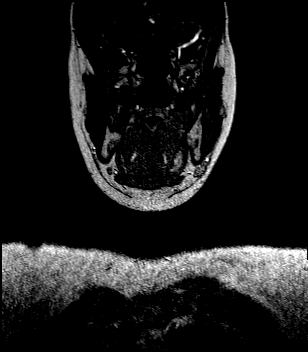
[im 88/88]
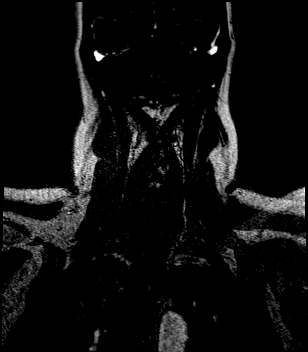

[Series 33: angio_fl3d_cor_post_ttc=3.0s_moco-adv · coronal · 0.9mm · 0.85mm/px · 2 of 88 slices shown (1 of 2)]
[im 1/88]
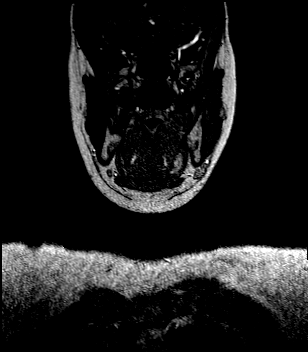
[im 88/88]
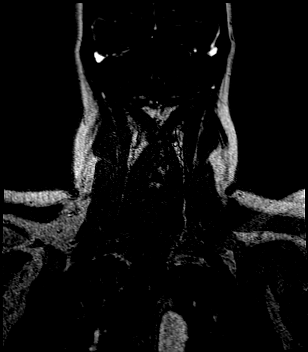

[Series 34: angio_fl3d_cor_post_ttc=3.0s_moco-adv_sub · coronal · 0.9mm · 0.85mm/px · 2 of 86 slices shown (1 of 2)]
[im 1/86]
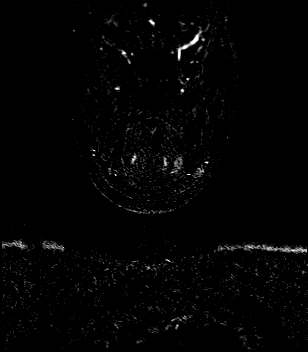
[im 86/86]
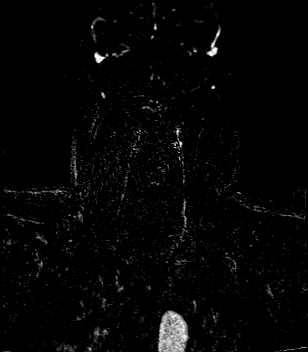

[Series 36: angio_fl3d_cor_post_ttc=3.0s · coronal · 0.9mm · 0.85mm/px · 2 of 88 slices shown (2 of 2)]
[im 1/88]
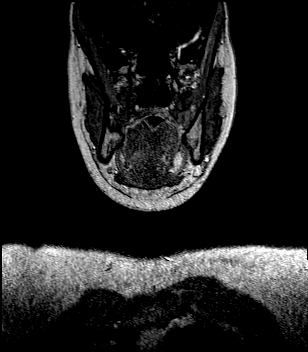
[im 88/88]
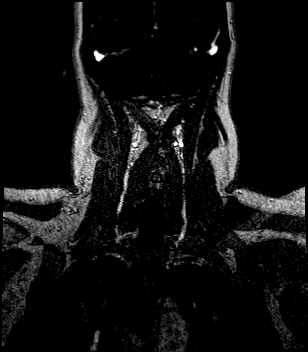

[Series 37: angio_fl3d_cor_post_ttc=3.0s_moco-adv · coronal · 0.9mm · 0.85mm/px · 2 of 88 slices shown (2 of 2)]
[im 1/88]
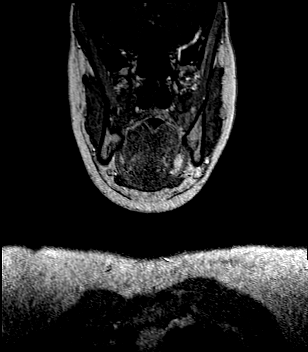
[im 88/88]
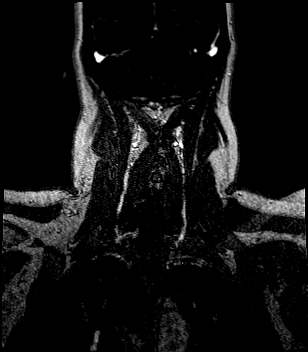

[Series 38: angio_fl3d_cor_post_ttc=3.0s_moco-adv_sub · coronal · 0.9mm · 0.85mm/px · 2 of 88 slices shown (2 of 2)]
[im 1/88]
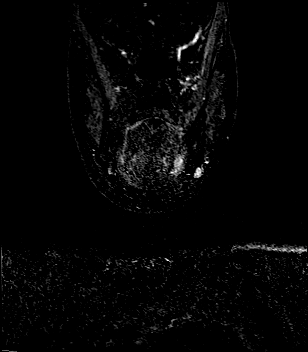
[im 88/88]
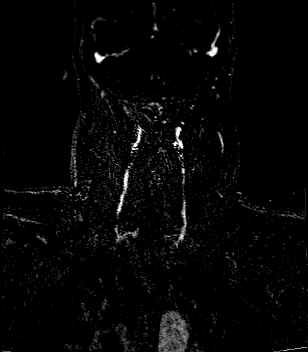

[Series 41: T1 post-contrast · coronal · 5.0mm · 0.34mm/px · 1 of 28 slices shown (1 of 2)]
[im 1/28]
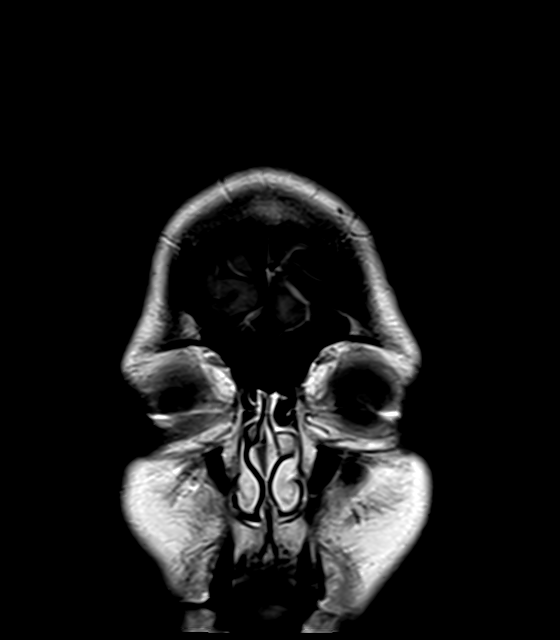

[Series 42: T1 post-contrast · sagittal · 5.0mm · 0.72mm/px · 1 of 23 slices shown (2 of 2)]
[im 1/23]
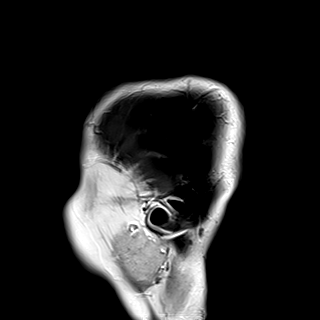

[Series 43: T2 post-contrast · coronal · 5.0mm · 0.72mm/px · 1 of 28 slices shown]
[im 1/28]
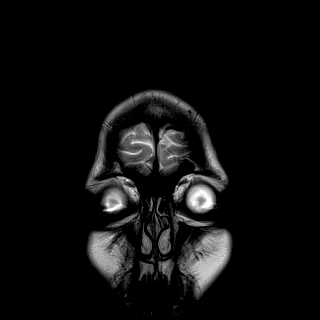

[36 of 48 positions shown; findings below may reference images not displayed]

FINDINGS: MRI HEAD FINDINGS

Brain: No acute infarction, hemorrhage, hydrocephalus, extra-axial
collection or mass lesion. Curvilinear midline T1 hyperintensity
extending along the superior aspect of the corpus callosum,
compatible with pericallosal lipoma. The corpus callosum is
otherwise unremarkable in appearance. Mild scattered T2/FLAIR
hyperintensities within the white matter, most likely related to
chronic microvascular ischemic disease.

Vascular: See below.

Skull and upper cervical spine: The area of bony thickening along
the inner table of the right frontal calvarium seen on same day CT
head does not enhance.

Sinuses/Orbits: Retention cyst in the right maxillary sinus.
Otherwise, clear sinuses. Unremarkable orbits.

Other: No mastoid effusions.

MRA HEAD FINDINGS

Anterior circulation: No large vessel occlusion, proximal
hemodynamically significant stenosis, or aneurysm. Small (1-2 mm)
outpouching arising from the distal left M1 MCA appears to have a
small vessel arising from its tip, compatible with an infundibulum.

Posterior circulation: No large vessel occlusion, proximal
hemodynamically significant stenosis, or aneurysm. Small (1 mm)
conical outpouching arising from the dista left l basilar artery
with a small artery rising from the tip, compatible infundibulum.

MRA NECK FINDINGS

Aorta: Great vessel origins are patent.

Carotid arteries: Patent without evidence of significant (greater
than 50%) stenosis.

Vertebral arteries: Mildly right dominant. Bilateral vertebral
arteries are patent without significant (greater than 50%) stenosis.
IMPRESSION: MRI:

1. No evidence of acute intracranial abnormality. Specifically, no
acute infarct.
2. Pericallosal lipoma.
3. The area of bony thickening along the inner table of the right
frontal calvarium seen on same day CT head does not enhance,
favoring bony hyperostosis over an ossified meningioma. No adjacent
brain edema or substantial mass effect.
4. Mild chronic microvascular ischemic disease.

MRA Head:

No large vessel occlusion or proximal hemodynamically significant
stenosis.

MRA Neck:

No significant (greater than 50%) stenosis.

## 2021-01-28 MED ORDER — GADOBUTROL 1 MMOL/ML IV SOLN
10.0000 mL | Freq: Once | INTRAVENOUS | Status: AC | PRN
  Administered 2021-01-28: 10 mL via INTRAVENOUS

## 2021-01-28 NOTE — ED Notes (Signed)
Report received. Pt not present. Pt in MRI.

## 2021-01-28 NOTE — Discharge Instructions (Addendum)
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  You will need to follow-up with your primary care physician and neurologist.  If you develop continued, recurrent, or worsening headache, fever, neck stiffness, vomiting, blurry or double vision, weakness or numbness in your arms or legs, trouble speaking, or any other new/concerning symptoms then return to the ER for evaluation.

## 2021-01-28 NOTE — ED Provider Notes (Signed)
Care transferred to me.  Patient has no emergent findings on MRI including no obvious MS.  Angiography is unremarkable.  There is a bony hyperostosis versus less likely meningioma, will need to follow-up with neurology.  I have referred her to neurology and back to her PCP for the hypertension.  Given return precautions.   Sherwood Gambler, MD 01/28/21 [phone]

## 2021-01-28 NOTE — ED Provider Notes (Signed)
[facility] EMERGENCY DEPARTMENT Provider Note   CSN: [id number] Arrival date & time: 01/27/21  1027     History Chief Complaint  Patient presents with   Shoulder Pain   Headache    [name] is a 40 y.o. female.  40 yo F w/ PMH as documented below who presents emerged from today with shoulder pain and headache.  Patient states that she is has a history of headaches for the last 3 weeks of progressively worsening headaches associated with high blood pressure she has not had these bad headaches or blood pressure in the past.  She started hydrochlorothiazide.  She states that now she is also having some left arm pain and weakness.  She states this is new.  She states that she has pain that radiates across her neck and down her arm and makes it difficult for her to move.  No [place] at this before.  She had MRI done on the eighth that showed a couple small areas that could be microvascular ischemia versus MS.  She had recurrent of her headache today which is very severe nature and her primary doctor asked her to come emergency room for further evaluation.  She has no other neurologic changes.  She has no recent illnesses.  No fevers.  No neck stiffness.  No lower extremity symptoms.   Shoulder Pain Headache     Past Medical History:  Diagnosis Date   Chicken pox    UTI (urinary tract infection)     Patient Active Problem List   Diagnosis Date Noted   Dysmenorrhea 09/29/2019   Dysplastic nevi 11/29/2012   Polyp of anterior nasal cavity 03/02/2012   Atopic dermatitis 11/26/2009   [place] DISEASE, CERVICAL 04/19/2008   PLANTAR FASCIITIS 04/19/2008    Past Surgical History:  Procedure Laterality Date   CESAREAN SECTION     2002,2007,2010   TUBAL LIGATION  2010     OB History   No obstetric history on file.     Family History  Problem Relation Age of Onset   Asthma Mother    Diabetes Mother    Healthy Father    Kidney Stones Brother    Kidney  Stones Sister    Kidney Stones Brother    Other Maternal Grandmother        82 years old in 2021    Social History   Tobacco Use   Smoking status: Never   Smokeless tobacco: Never  Substance Use Topics   Alcohol use: No   Drug use: No    Home Medications Prior to Admission medications   Medication Sig Start Date End Date Taking? Authorizing Provider  albuterol (VENTOLIN HFA) 108 (90 Base) MCG/ACT inhaler Inhale 2 puffs into the lungs every 6 (six) hours as needed for wheezing or shortness of breath. 01/02/21  Yes Marin Olp, MD  hydrochlorothiazide (MICROZIDE) 12.5 MG capsule Take 1 capsule (12.5 mg total) by mouth daily. 01/14/21  Yes Marin Olp, MD  ibuprofen (ADVIL) 200 MG tablet Take 400-600 mg by mouth every 6 (six) hours as needed for moderate pain or headache.   Yes [provider]  Norethin Ace-Eth Estrad-FE (TAYTULLA PO) Take 1 tablet by mouth at bedtime.   Yes [provider]    Allergies    Patient has no known allergies.  Review of Systems   Review of Systems  Neurological:  Positive for headaches.  All other systems reviewed and are negative.  Physical Exam Updated  Vital Signs BP (!) 159/99 (BP Location: Left Arm)   Pulse 91   Temp 98.2 F (36.8 C) (Oral)   Resp 16   LMP  (LMP Unknown)   SpO2 95%   Physical Exam Vitals and nursing note reviewed.  Constitutional:      Appearance: She is well-developed.  HENT:     Head: Normocephalic and atraumatic.     Mouth/Throat:     Mouth: Mucous membranes are moist.     Pharynx: Oropharynx is clear.  Eyes:     Pupils: Pupils are equal, round, and reactive to light.  Cardiovascular:     Rate and Rhythm: Normal rate and regular rhythm.  Pulmonary:     Effort: Pulmonary effort is normal. No respiratory distress.     Breath sounds: No stridor.  Abdominal:     General: Abdomen is flat. There is no distension.  Musculoskeletal:        General: No swelling or tenderness. Normal  range of motion.     Cervical back: Normal range of motion.  Skin:    General: Skin is warm and dry.  Neurological:     General: No focal deficit present.     Mental Status: She is alert.     GCS: GCS eye subscore is 4. GCS verbal subscore is 5. GCS motor subscore is 6.     Comments: Barely noticeable (compared to right) left grip strength weakness.  Mild right mouth droop (confirmed to be chronic on old picture) Normal/symmetric LE strength and sensation to light touch. A&O X 4  Psychiatric:        Mood and Affect: Mood normal.    ED Results / Procedures / Treatments   Labs (all labs ordered are listed, but only abnormal results are displayed) Labs Reviewed  BASIC METABOLIC PANEL - Abnormal; Notable for the following components:      Result Value   Glucose, Bld 109 (*)    All other components within normal limits  CBC - Abnormal; Notable for the following components:   Hemoglobin 15.8 (*)    All other components within normal limits  TROPONIN I (HIGH SENSITIVITY)  TROPONIN I (HIGH SENSITIVITY)    EKG EKG Interpretation  Date/Time:  Monday January 27 2021 10:36:31 EDT Ventricular Rate:  108 PR Interval:  160 QRS Duration: 94 QT Interval:  328 QTC Calculation: 439 R Axis:   94 Text Interpretation: Sinus tachycardia Rightward axis Cannot rule out Anterior infarct , age undetermined Abnormal ECG no acute ischemia Confirmed by Lorre Munroe ([id number]) on 01/27/2021 4:25:59 PM  Radiology DG Chest 2 View  Result Date: 01/27/2021 CLINICAL DATA:  Chest pain. EXAM: CHEST - 2 VIEW COMPARISON:  Jan 02, 2021. FINDINGS: The heart size and mediastinal contours are within normal limits. Both lungs are clear. No visible pleural effusions or pneumothorax. No acute osseous abnormality. Thoracic spine degenerative change. IMPRESSION: No active cardiopulmonary disease. Electronically Signed   By: Margaretha Sheffield MD   On: 01/27/2021 11:11   CT Head Wo Contrast  Result Date: 01/27/2021 CLINICAL  DATA:  Headache EXAM: CT HEAD WITHOUT CONTRAST TECHNIQUE: Contiguous axial images were obtained from the base of the skull through the vertex without intravenous contrast. COMPARISON:  Brain MRI January 22, 2021 FINDINGS: Brain: The ventricles and sulci are normal in size and configuration. Prominence of the cisterna magna is an anatomic variant. There is an apparent lipoma along the periphery of the mid and posterior corpus callosum. There is a focus of calcification  along the junction of the frontal and parietal bones measuring 0.8 x 0.8 cm. 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 This area potentially could represent a small calcified meningioma. No associated mass effect or edema. No other evident mass. No evident hemorrhage, extra-axial fluid collection, or midline shift. Brain parenchyma appears unremarkable by CT without evident acute infarct. Vascular: No hyperdense vessel. No appreciable vascular calcification. Skull: Bony calvarium appears intact. Probable right-sided 8 mm enostosis as noted above. Sinuses/Orbits: Paranasal sinuses clear. Orbits appear symmetric bilaterally. Other: Mastoid air cells clear. IMPRESSION: Benign lipoma tracking along the periphery of the mid and posterior corpus callosum in the midline. Suspected enostosis measuring 8 x 8 mm near the frontal-parietal junction. This area conceivably could represent a subcentimeter meningioma without mass effect or edema. This finding is not felt to have clinical significance. 3. Brain parenchyma unremarkable without evidence suggesting acute infarct. No hemorrhage. Electronically Signed   By: Lowella Grip III M.D.   On: 01/27/2021 11:22   MR ANGIO HEAD WO CONTRAST  Result Date: 01/28/2021 CLINICAL DATA:  Neuro deficit, acute stroke suspected.  Dizziness. EXAM: MRI HEAD WITHOUT AND WITH CONTRAST MRA HEAD WITHOUT CONTRAST MRA NECK WITHOUT AND WITH CONTRAST TECHNIQUE: Multiplanar, multiecho pulse sequences of the brain and surrounding structures were  obtained without and with intravenous contrast. Angiographic images of the Circle of Willis were obtained using MRA technique without intravenous contrast. Angiographic images of the neck were obtained using MRA technique without and with intravenous contrast. Carotid stenosis measurements (when applicable) are obtained utilizing NASCET criteria, using the distal internal carotid diameter as the denominator. CONTRAST:  66mL GADAVIST GADOBUTROL 1 MMOL/ML IV SOLN COMPARISON:  January 27, 2021 CT head. FINDINGS: MRI HEAD FINDINGS Brain: No acute infarction, hemorrhage, hydrocephalus, extra-axial collection or mass lesion. Curvilinear midline T1 hyperintensity extending along the superior aspect of the corpus callosum, compatible with pericallosal lipoma. The corpus callosum is otherwise unremarkable in appearance. Mild scattered T2/FLAIR hyperintensities within the white matter, most likely related to chronic microvascular ischemic disease. Vascular: See below. Skull and upper cervical spine: The area of bony thickening along the inner table of the right frontal calvarium seen on same day CT head does not enhance. Sinuses/Orbits: Retention cyst in the right maxillary sinus. Otherwise, clear sinuses. Unremarkable orbits. Other: No mastoid effusions. MRA HEAD FINDINGS Anterior circulation: No large vessel occlusion, proximal hemodynamically significant stenosis, or aneurysm. Small (1-2 mm) outpouching arising from the distal left M1 MCA appears to have a small vessel arising from its tip, compatible with an infundibulum. Posterior circulation: No large vessel occlusion, proximal hemodynamically significant stenosis, or aneurysm. Small (1 mm) conical outpouching arising from the dista left l basilar artery with a small artery rising from the tip, compatible infundibulum. MRA NECK FINDINGS Aorta: Great vessel origins are patent. Carotid arteries: Patent without evidence of significant (greater than 50%) stenosis. Vertebral  arteries: Mildly right dominant. Bilateral vertebral arteries are patent without significant (greater than 50%) stenosis. IMPRESSION: MRI: 1. No evidence of acute intracranial abnormality. Specifically, no acute infarct. 2. Pericallosal lipoma. 3. 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 No adjacent brain edema or substantial mass effect. 4. Mild chronic microvascular ischemic disease. MRA Head: No large vessel occlusion or proximal hemodynamically significant stenosis. MRA Neck: No significant (greater than 50%) stenosis. Electronically Signed   By: Margaretha Sheffield MD   On: 01/28/2021 08:26  MR Angiogram Neck W or Wo Contrast  Result Date: 01/28/2021 CLINICAL DATA:  Neuro deficit, acute stroke suspected.  Dizziness. EXAM: MRI HEAD WITHOUT AND WITH CONTRAST MRA HEAD WITHOUT CONTRAST MRA NECK WITHOUT AND WITH CONTRAST TECHNIQUE: Multiplanar, multiecho pulse sequences of the brain and surrounding structures were obtained without and with intravenous contrast. Angiographic images of the Circle of Willis were obtained using MRA technique without intravenous contrast. Angiographic images of the neck were obtained using MRA technique without and with intravenous contrast. Carotid stenosis measurements (when applicable) are obtained utilizing NASCET criteria, using the distal internal carotid diameter as the denominator. CONTRAST:  20mL GADAVIST GADOBUTROL 1 MMOL/ML IV SOLN COMPARISON:  January 27, 2021 CT head. FINDINGS: MRI HEAD FINDINGS Brain: No acute infarction, hemorrhage, hydrocephalus, extra-axial collection or mass lesion. Curvilinear midline T1 hyperintensity extending along the superior aspect of the corpus callosum, compatible with pericallosal lipoma. The corpus callosum is otherwise unremarkable in appearance. Mild scattered T2/FLAIR hyperintensities within the white  matter, most likely related to chronic microvascular ischemic disease. Vascular: See below. Skull and upper cervical spine: The area of bony thickening along the inner table of the right frontal calvarium seen on same day CT head does not enhance. Sinuses/Orbits: Retention cyst in the right maxillary sinus. Otherwise, clear sinuses. Unremarkable orbits. Other: No mastoid effusions. MRA HEAD FINDINGS Anterior circulation: No large vessel occlusion, proximal hemodynamically significant stenosis, or aneurysm. Small (1-2 mm) outpouching arising from the distal left M1 MCA appears to have a small vessel arising from its tip, compatible with an infundibulum. Posterior circulation: No large vessel occlusion, proximal hemodynamically significant stenosis, or aneurysm. Small (1 mm) conical outpouching arising from the dista left l basilar artery with a small artery rising from the tip, compatible infundibulum. MRA NECK FINDINGS Aorta: Great vessel origins are patent. Carotid arteries: Patent without evidence of significant (greater than 50%) stenosis. Vertebral arteries: Mildly right dominant. Bilateral vertebral arteries are patent without significant (greater than 50%) stenosis. IMPRESSION: MRI: 1. No evidence of acute intracranial abnormality. Specifically, no acute infarct. 2. Pericallosal lipoma. 3. 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 No adjacent brain edema or substantial mass effect. 4. Mild chronic microvascular ischemic disease. MRA Head: No large vessel occlusion or proximal hemodynamically significant stenosis. MRA Neck: No significant (greater than 50%) stenosis. Electronically Signed   By: Margaretha Sheffield MD   On: 01/28/2021 08:26   MR Brain W and Wo Contrast  Result Date: 01/28/2021 CLINICAL DATA:  Neuro deficit, acute stroke suspected.  Dizziness. EXAM: MRI HEAD  WITHOUT AND WITH CONTRAST MRA HEAD WITHOUT CONTRAST MRA NECK WITHOUT AND WITH CONTRAST TECHNIQUE: Multiplanar, multiecho pulse sequences of the brain and surrounding structures were obtained without and with intravenous contrast. Angiographic images of the Circle of Willis were obtained using MRA technique without intravenous contrast. Angiographic images of the neck were obtained using MRA technique without and with intravenous contrast. Carotid stenosis measurements (when applicable) are obtained utilizing NASCET criteria, using the distal internal carotid diameter as the denominator. CONTRAST:  84mL GADAVIST GADOBUTROL 1 MMOL/ML IV SOLN COMPARISON:  January 27, 2021 CT head. FINDINGS: MRI HEAD FINDINGS Brain: No acute infarction, hemorrhage, hydrocephalus, extra-axial collection or mass lesion. Curvilinear midline T1 hyperintensity extending along the superior aspect of the corpus callosum, compatible with pericallosal lipoma. The corpus callosum is otherwise unremarkable in appearance. Mild scattered T2/FLAIR hyperintensities within the  white matter, most likely related to chronic microvascular ischemic disease. Vascular: See below. Skull and upper cervical spine: The area of bony thickening along the inner table of the right frontal calvarium seen on same day CT head does not enhance. Sinuses/Orbits: Retention cyst in the right maxillary sinus. Otherwise, clear sinuses. Unremarkable orbits. Other: No mastoid effusions. MRA HEAD FINDINGS Anterior circulation: No large vessel occlusion, proximal hemodynamically significant stenosis, or aneurysm. Small (1-2 mm) outpouching arising from the distal left M1 MCA appears to have a small vessel arising from its tip, compatible with an infundibulum. Posterior circulation: No large vessel occlusion, proximal hemodynamically significant stenosis, or aneurysm. Small (1 mm) conical outpouching arising from the dista left l basilar artery with a small artery rising from the tip,  compatible infundibulum. MRA NECK FINDINGS Aorta: Great vessel origins are patent. Carotid arteries: Patent without evidence of significant (greater than 50%) stenosis. Vertebral arteries: Mildly right dominant. Bilateral vertebral arteries are patent without significant (greater than 50%) stenosis. IMPRESSION: MRI: 1. No evidence of acute intracranial abnormality. Specifically, no acute infarct. 2. Pericallosal lipoma. 3. 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 No adjacent brain edema or substantial mass effect. 4. Mild chronic microvascular ischemic disease. MRA Head: No large vessel occlusion or proximal hemodynamically significant stenosis. MRA Neck: No significant (greater than 50%) stenosis. Electronically Signed   By: Margaretha Sheffield MD   On: 01/28/2021 08:26    Procedures Procedures   Medications Ordered in ED Medications  gadobutrol (GADAVIST) 1 MMOL/ML injection 10 mL (10 mLs Intravenous Contrast Given 01/28/21 0756)    ED Course  I have reviewed the triage vital signs and the nursing notes.  Pertinent labs & imaging results that were available during my care of the patient were reviewed by me and considered in my medical decision making (see chart for details).    MDM Rules/Calculators/A&P                          We will reimage patient's brain with contrast to evaluate for multiple sclerosis as this could be related to her symptoms although less likely.  We will also evaluate for stroke as she has new headache that is worse and new left arm symptoms.  We will also do MRI of her neck and cervical spine to make sure this is not a myelopathy.  If these are negative she can continue to follow-up with her neurologist as scheduled.  Care transferred pending MRI.  Final Clinical Impression(s) / ED Diagnoses Final diagnoses:  Bad headache    Rx  / DC Orders ED Discharge Orders          Ordered    Ambulatory referral to Neurology       Comments: An appointment is requested in approximately: 2 weeks   01/28/21 0846             Denea Cheaney, Corene Cornea, MD 01/28/21 2305

## 2021-01-28 NOTE — ED Notes (Signed)
Pt returns from MRI. Alert, NAD, calm, interactive, resps e/u, speaking in clear complete sentences, VSS. Feels better. Admits to some improved shoulder pain, 3/10. Denies HA, sob, nausea, blurry vision, numbness, tingling, or other sx.

## 2021-02-10 ENCOUNTER — Ambulatory Visit: Payer: No Typology Code available for payment source | Admitting: Family Medicine

## 2021-02-10 ENCOUNTER — Encounter: Payer: Self-pay | Admitting: Family Medicine

## 2021-02-10 ENCOUNTER — Other Ambulatory Visit: Payer: Self-pay

## 2021-02-10 VITALS — BP 140/92 | HR 89 | Temp 98.4°F | Ht 68.0 in | Wt 224.2 lb

## 2021-02-10 DIAGNOSIS — G8929 Other chronic pain: Secondary | ICD-10-CM

## 2021-02-10 DIAGNOSIS — R0789 Other chest pain: Secondary | ICD-10-CM

## 2021-02-10 DIAGNOSIS — M5030 Other cervical disc degeneration, unspecified cervical region: Secondary | ICD-10-CM | POA: Diagnosis not present

## 2021-02-10 DIAGNOSIS — M25512 Pain in left shoulder: Secondary | ICD-10-CM | POA: Diagnosis not present

## 2021-02-10 DIAGNOSIS — E785 Hyperlipidemia, unspecified: Secondary | ICD-10-CM | POA: Diagnosis not present

## 2021-02-10 NOTE — Progress Notes (Signed)
Phone [phone] In person visit   Subjective:   [name] is a 40 y.o. year old very pleasant female patient who presents for/with See problem oriented charting Chief Complaint  Patient presents with   Emergency Room Follow-Up    Medication Check    Following up on the BP medication that you started her on 1 month ago     This visit occurred during the SARS-CoV-2 public health emergency.  Safety protocols were in place, including screening questions prior to the visit, additional usage of staff PPE, and extensive cleaning of exam room while observing appropriate contact time as indicated for disinfecting solutions.   Past Medical History-  Patient Active Problem List   Diagnosis Date Noted   Hyperlipidemia 02/10/2021    Priority: Medium   Dysmenorrhea 09/29/2019    Priority: Medium   Dysplastic nevi 11/29/2012    Priority: Low   Polyp of anterior nasal cavity 03/02/2012    Priority: Low   Atopic dermatitis 11/26/2009    Priority: Low   DISC DISEASE, CERVICAL 04/19/2008    Priority: Low   PLANTAR FASCIITIS 04/19/2008    Priority: Low    Medications- reviewed and updated Current Outpatient Medications  Medication Sig Dispense Refill   albuterol (VENTOLIN HFA) 108 (90 Base) MCG/ACT inhaler Inhale 2 puffs into the lungs every 6 (six) hours as needed for wheezing or shortness of breath. 1 each 0   ibuprofen (ADVIL) 200 MG tablet Take 400-600 mg by mouth every 6 (six) hours as needed for moderate pain or headache.     Norethin Ace-Eth Estrad-FE (TAYTULLA PO) Take 1 tablet by mouth at bedtime.     No current facility-administered medications for this visit.     Objective:  BP (!) 140/92   Pulse 89   Temp 98.4 F (36.9 C) (Temporal)   Ht 5\' 8"  (1.727 m)   Wt 224 lb 3.2 oz (101.7 kg)   SpO2 96%   BMI 34.09 kg/m  Gen: NAD, resting comfortably CV: RRR no murmurs rubs or gallops Left chest wall discomfort with palpation  Lungs: CTAB no crackles, wheeze,  rhonchi Abdomen: soft/nontender/nondistended/normal bowel sounds. No rebound or guarding.  Ext: trace edema Skin: warm, dry     Assessment and Plan   #Headaches #Chest pain # Left shoulder pain  S: patient was seen in the emergency room on January 27, 2021-patient had reimaging of her brain with MRI to evaluate for multiple sclerosis as well as MRI of Cervical spine-she was also referred to a neurologist and has a visit upcoming in September  She has not had any headaches that severe since that time. Has had headaches every other day- today is feeling better  Does get some chest discomfort with palpitation that worsens it. Pain rated 1/10 since getting out of hospital. No shortness of breath. Troponins not elevated. If working out in the yard or pushing herself then she ends up with worsening pain and can go from chest into shoulder and arm  Left shoulder pain- history of this for at least 12 years on and off- on day of ER trip was having pain that started in neck and into shoulder and into arm- then headache started on the left. Was told years ago had bulging disc possibly by Dr. Sherren Mocha- she thought this could be recurrence of that. Had tingling down into the left hand. Er physician thought likely nerve related and not heart related.  - she has the ability to see Dr. Marlou Sa later  this week but needs referral -today 1/10. Pain other day 10/10.  A/P: Patient with continued headaches-fortunately with extensive work-up in the emergency room overall reassuring.  With contrast studies it appears that area of previously thought to be meningioma is more likely to be hyperostosis-she is going to check with her gynecologist as well as neurologist if birth control could be related-I would lean on them for their experience on this - She also has a small lipoma which I do not think is contributing to pain - Does have some microvascular ischemic changes and I believe we need to control blood pressure and  cholesterol-she would like to be cautious about adding medication particularly with how she responded poorly to hydrochlorothiazide and seem to actually worsen her symptoms  In regards to her chest discomfort-certainly could be costochondritis as her discomfort worsens when the area is palpated-on the other hand when she is active feels that pain worsens and with risk factors of mild hyperlipidemia and now findings of chronic microvascular ischemia on MRI of the brain-we ultimately decided to get cardiology opinion to be on the safe side  For her left shoulder pain-she has a contact with [facility] and would like to get plugged in with Dr. Lovena Le was placed today.  Since this also goes up into her neck and down into her arm I am concerned could have a pinched nerve-Dr. Sherren Mocha was concerned about cervical arthritis in the past.  We discussed possibly starting prednisone but with pain only being 1 out of 10 at present she wants to talk with orthopedics first-we also discussed possible physical therapy and she will discuss with Dr. Marlou Sa  If she has new or worsening symptoms and these occur in the daytime she will contact us but it happened after hours we discussed seeking care   #hypertension S: medication: none Hydrochlorothiazide 12.5 mg started last visit- she felt like all of her symptoms magnified on this- blurry vision, dizzy, pain and tingling down left arm. Also sweated more on hctz One day when she got out of church and was feeling like as above was seen by an ER nurse- evaluated her but did not have his medic bag so advised her to go home check BP- BP was >170 and called here and was told to go to ER. Ended up having EKG in the ER Home readings #s: still elevated 130s to 353G but diastolic is the main issue 97-109.  BP Readings from Last 3 Encounters:  02/10/21 (!) 140/92  01/28/21 (!) 159/99  01/02/21 (!) 168/110  A/P: Blood pressure remains poorly controlled but overall seems  better than the last few visits-Home readings above goal less than 135/85-in particular her diastolic readings in the 99-2 09 range concern me-diastolic readings can be responsive to lifestyle changes she wants to aggressively work on healthy eating/regular exercise (I would have her hold off on increasing exercise until evaluated by cardiology) as well as weight loss.  She would also like to try hibiscus tea and update me in 2 weeks-if not improving consider amlodipine low-dose 2.5 mg.  She is 1 update me in 2 weeks via MyChart. 1-2 motnh follow up discussed  Recommended follow up: Return in about 1 month (around 03/12/2021) for follow up- or sooner if needed. Future Appointments  Date Time Provider [place]  04/30/2021 10:00 AM Star Age, MD [facility] None   Lab/Order associations:   ICD-10-CM   1. Chronic left shoulder pain  M25.512 Ambulatory referral to Orthopedics  G89.29     2. Chest discomfort  R07.89 Ambulatory referral to Cardiology    3. Hyperlipidemia, unspecified hyperlipidemia type  E78.5     4. DISC DISEASE, CERVICAL  M50.30      Time Spent: 43 minutes of total time (10:15 AM- 10:58 AM) was spent on the date of the encounter performing the following actions: chart review prior to seeing the patient, obtaining history, performing a medically necessary exam, counseling on the treatment plan, placing orders, and documenting in our EHR.   Return precautions advised.  Garret Reddish, MD

## 2021-02-10 NOTE — Patient Instructions (Addendum)
Health Maintenance Due  Topic Date Due   PAP SMEAR-Modifier Scheduled for tomorrow will sign a release for at checkout.  07/06/2017   For headaches and MRI findings- lets get neurology opinion  For left shoulder pain- lets follow up with Dr. Marlou Sa   For chest discomfort We will call you within two weeks about your referral to cardiology. If you do not hear within 2 weeks, give [place] a call. If worsening symptoms please seek care.   For blood pressure I think starting another option like amlodipine would be a good step. You wanted to try hibiscus tea for 2 weeks and then update me  Recommended follow up: 1-2 months follow up

## 2021-02-11 LAB — HM MAMMOGRAPHY

## 2021-02-11 LAB — HM PAP SMEAR

## 2021-02-13 ENCOUNTER — Ambulatory Visit (INDEPENDENT_AMBULATORY_CARE_PROVIDER_SITE_OTHER): Payer: No Typology Code available for payment source

## 2021-02-13 ENCOUNTER — Encounter: Payer: Self-pay | Admitting: Orthopedic Surgery

## 2021-02-13 ENCOUNTER — Ambulatory Visit: Payer: Self-pay

## 2021-02-13 ENCOUNTER — Ambulatory Visit: Payer: No Typology Code available for payment source | Admitting: Orthopedic Surgery

## 2021-02-13 ENCOUNTER — Encounter: Payer: Self-pay | Admitting: Family Medicine

## 2021-02-13 DIAGNOSIS — M5412 Radiculopathy, cervical region: Secondary | ICD-10-CM

## 2021-02-13 DIAGNOSIS — M79602 Pain in left arm: Secondary | ICD-10-CM | POA: Diagnosis not present

## 2021-02-13 NOTE — Progress Notes (Signed)
Office Visit Note   Patient: [name]           Date of Birth: [birth year]           MRN: [id number] Visit Date: 02/13/2021 Requested by: Marin Olp, [address] [place],  [place] [address] PCP: Marin Olp, MD  Subjective: Chief Complaint  Patient presents with   Left Shoulder - Pain   Neck - Pain    HPI: Jacques is a 40 year old patient with left arm and neck pain.  Denies any history of injury.  This is been going on for several months.  She has had an MRI scan of her brain which shows several lesions which do not appear to be cancer or MS.  She does have headaches but also reports pain which radiates into her scapula and down deep into the shoulder and into the hand with numbness and tingling associated.  She has had negative cardiac work-up.  She works as an Medical illustrator.  Occasionally the symptoms keep her up at night.  Denies any right-sided symptoms.  She does report some symptoms of weakness in the left arm.              ROS: All systems reviewed are negative as they relate to the chief complaint within the history of present illness.  Patient denies  fevers or chills.   Assessment & Plan: Visit Diagnoses:  1. Left arm pain   2. Cervical radiculopathy     Plan: Impression is history consistent with left-sided radiculopathy.  I think it is conceivable that a bulging disc could give her headaches.  Shoulder exam is pretty unremarkable.  Plan at this time is MRI C-spine to evaluate for left-sided radiculopathy.  We could consider imaging the shoulder after that if indicated but at this time I think it is most likely based on her history that this is coming from her cervical spine.  Follow-up after that study.  Follow-Up Instructions: Return for after MRI.   Orders:  Orders Placed This Encounter  Procedures   XR Shoulder Left   XR Cervical Spine 2 or 3 views   MR Cervical Spine w/o contrast   No orders of the defined types were placed in this  encounter.     Procedures: No procedures performed   Clinical Data: No additional findings.  Objective: Vital Signs: There were no vitals taken for this visit.  Physical Exam:   Constitutional: Patient appears well-developed HEENT:  Head: Normocephalic Eyes:EOM are normal Neck: Normal range of motion Cardiovascular: Normal rate Pulmonary/chest: Effort normal Neurologic: Patient is alert Skin: Skin is warm Psychiatric: Patient has normal mood and affect   Ortho Exam: Ortho exam demonstrates full active and passive range of motion of both shoulders.  She has excellent rotator cuff strength demonstrated supraspinatus subscap muscle testing.  Range of motion bilaterally passively is 60/110/180.  No coarse grinding or crepitus with active or passive range of motion of that left shoulder.  No discrete AC joint tenderness is present.  Patient has 5 out of 5 grip EPL FPL interosseous wrist flexion extension bicep triceps and deltoid strength.  No definite paresthesias C5-T1.  No muscle atrophy left arm versus right.  Radial pulse intact bilaterally.  Specialty Comments:  No specialty comments available.  Imaging: XR Shoulder Left  Result Date: 02/13/2021 AP axillary outlet radiographs left shoulder reviewed.  Joint is reduced.  No acute fracture.  No degenerative changes in the [facility] joint or glenohumeral  joint.  Visualized lung fields clear.  Normal radiographs left shoulder    PMFS History: Patient Active Problem List   Diagnosis Date Noted   Hyperlipidemia 02/10/2021   Dysmenorrhea 09/29/2019   Dysplastic nevi 11/29/2012   Polyp of anterior nasal cavity 03/02/2012   Atopic dermatitis 11/26/2009   DISC DISEASE, CERVICAL 04/19/2008   PLANTAR FASCIITIS 04/19/2008   Past Medical History:  Diagnosis Date   Chicken pox    UTI (urinary tract infection)     Family History  Problem Relation Age of Onset   Asthma Mother    Diabetes Mother    Healthy Father    Kidney Stones  Brother    Kidney Stones Sister    Kidney Stones Brother    Other Maternal Grandmother        82 years old in 2021    Past Surgical History:  Procedure Laterality Date   CESAREAN SECTION     2002,2007,2010   TUBAL LIGATION  2010   Social History   Occupational History   Not on file  Tobacco Use   Smoking status: Never   Smokeless tobacco: Never  Substance and Sexual Activity   Alcohol use: No   Drug use: No   Sexual activity: Yes

## 2021-02-14 ENCOUNTER — Other Ambulatory Visit: Payer: No Typology Code available for payment source

## 2021-02-14 ENCOUNTER — Other Ambulatory Visit: Payer: Self-pay

## 2021-02-14 ENCOUNTER — Ambulatory Visit
Admission: RE | Admit: 2021-02-14 | Discharge: 2021-02-14 | Disposition: A | Discharge status: Home / Self Care | Payer: No Typology Code available for payment source | Source: Ambulatory Visit | Attending: Orthopedic Surgery | Admitting: Orthopedic Surgery

## 2021-02-14 DIAGNOSIS — M79602 Pain in left arm: Secondary | ICD-10-CM

## 2021-02-14 DIAGNOSIS — M5412 Radiculopathy, cervical region: Secondary | ICD-10-CM

## 2021-02-14 IMAGING — MR MR CERVICAL SPINE W/O CM
5 series · 37 of 48 positions shown · non-contrast
Comparison: [DATE] cervical radiographs.

CLINICAL DATA: Left radicular arm pain. Hand/arm numbness.
Headaches.

EXAM:
MRI CERVICAL SPINE WITHOUT CONTRAST
TECHNIQUE: Multiplanar, multisequence MR imaging of the cervical spine was
performed. No intravenous contrast was administered.

[Series 2: T2 · sagittal · 3.0mm · 0.41mm/px · 8 of 17 slices shown (1 of 2)]
[im 1/17]
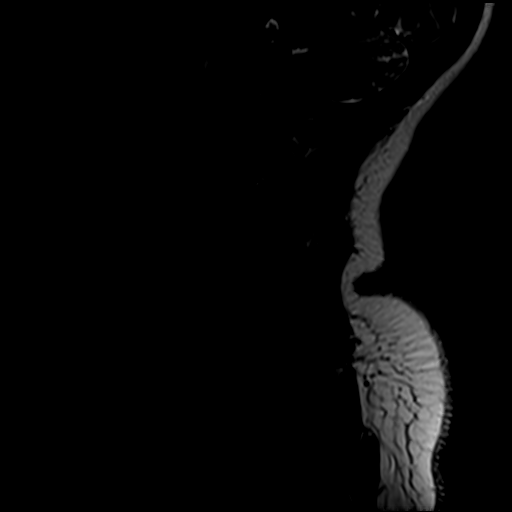
[im 3/17]
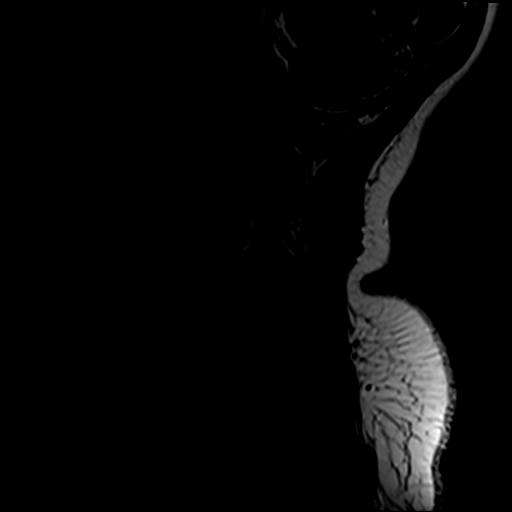
[im 5/17]
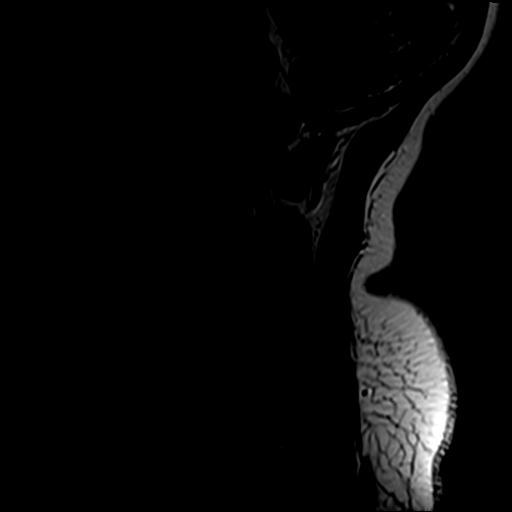
[im 7/17]
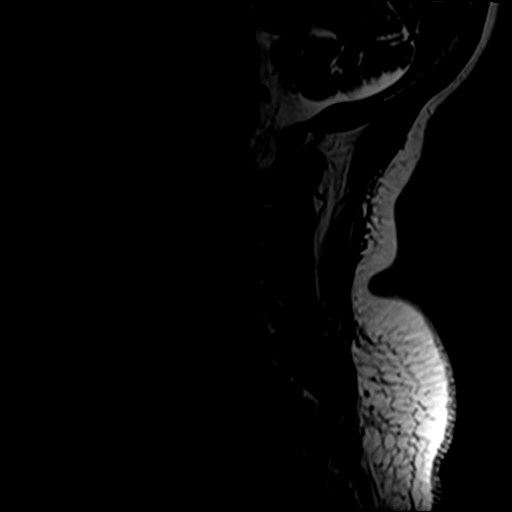
[im 10/17]
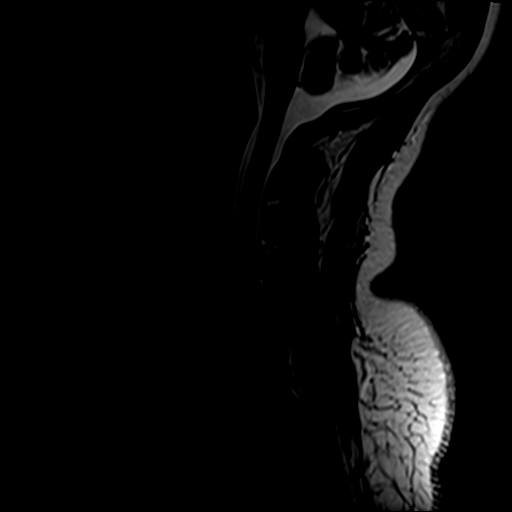
[im 12/17]
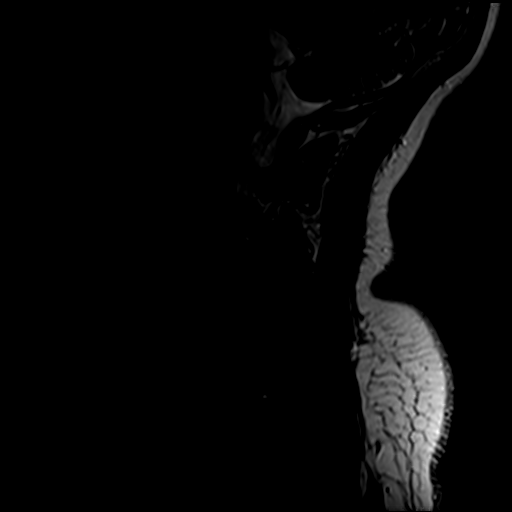
[im 14/17]
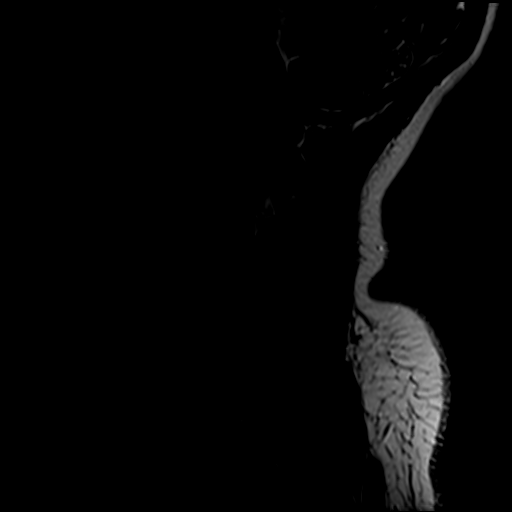
[im 17/17]
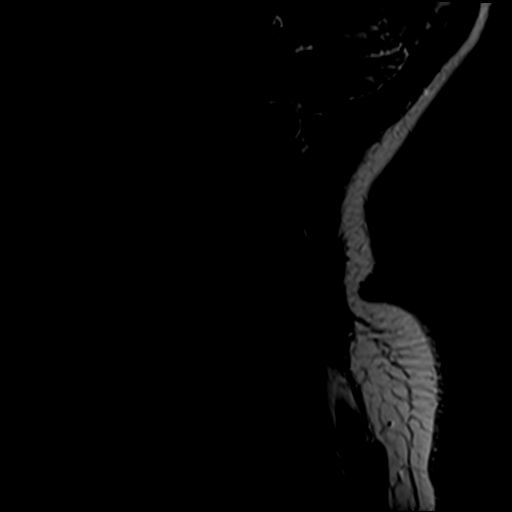

[Series 3: STIR · sagittal · 3.0mm · 0.82mm/px · 7 of 17 slices shown]
[im 1/17]
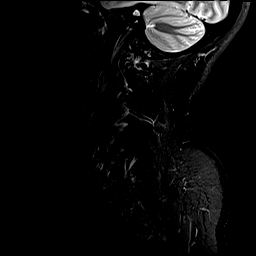
[im 3/17]
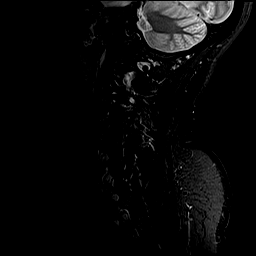
[im 6/17]
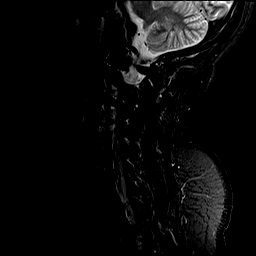
[im 9/17]
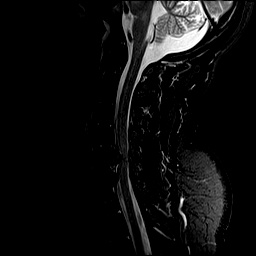
[im 11/17]
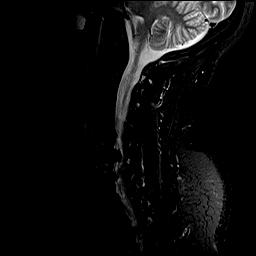
[im 14/17]
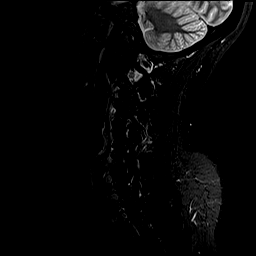
[im 17/17]
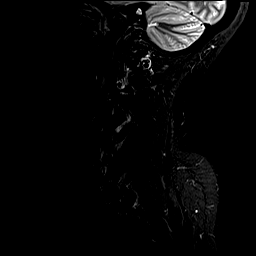

[Series 4: T1 · sagittal · 3.0mm · 0.82mm/px · 7 of 17 slices shown]
[im 1/17]
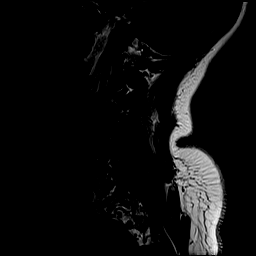
[im 3/17]
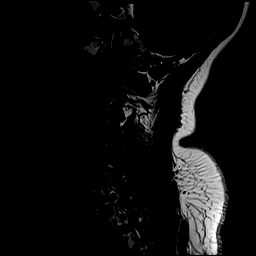
[im 6/17]
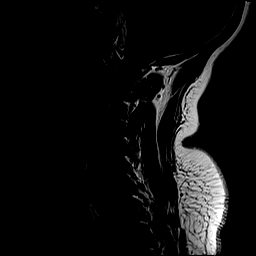
[im 9/17]
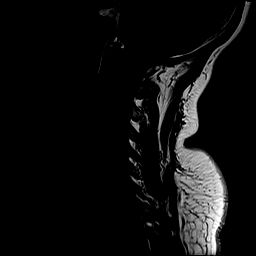
[im 11/17]
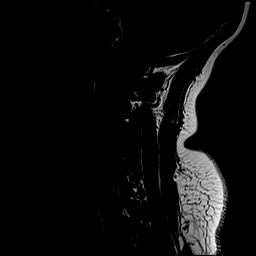
[im 14/17]
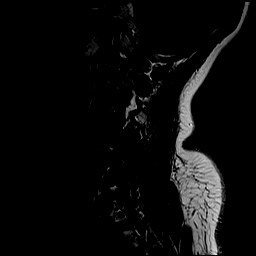
[im 17/17]
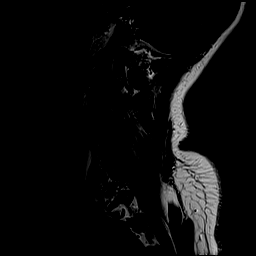

[Series 5: T2 · axial · 3.0mm · 0.70mm/px · z∈[-75,+27]mm · 9 of 29 slices shown (2 of 2)]
[im 1/29]
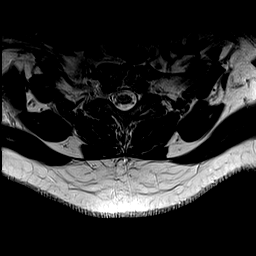
[im 5/29]
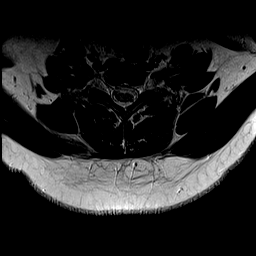
[im 10/29]
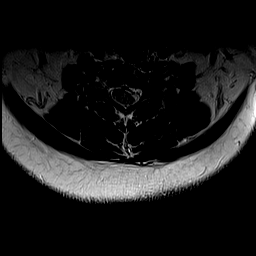
[im 12/29]
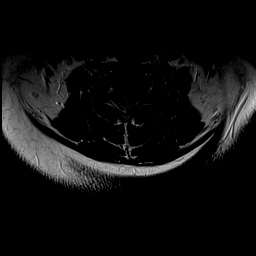
[im 15/29]
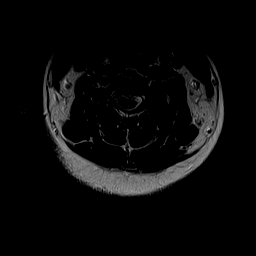
[im 17/29]
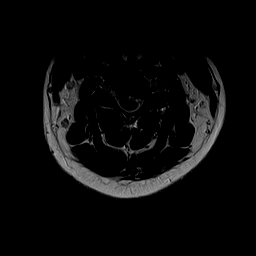
[im 19/29]
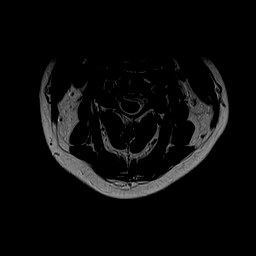
[im 24/29]
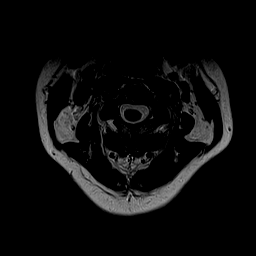
[im 29/29]
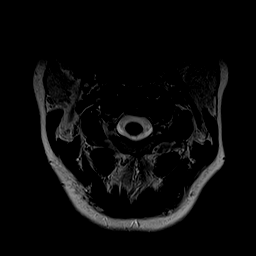

[Series 6: GRE · axial · 3.0mm · 0.47mm/px · z∈[-75,-10]mm · 6 of 29 slices shown]
[im 1/29]
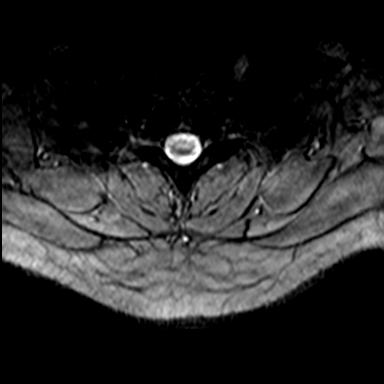
[im 5/29]
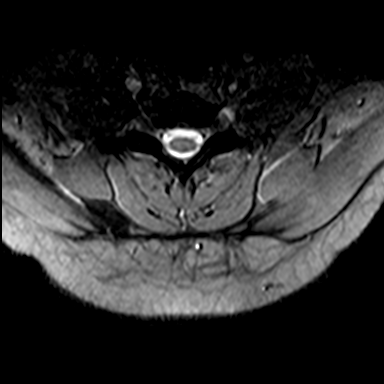
[im 10/29]
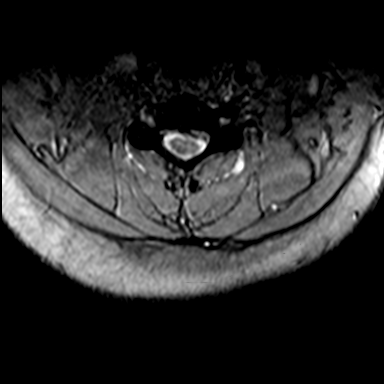
[im 12/29]
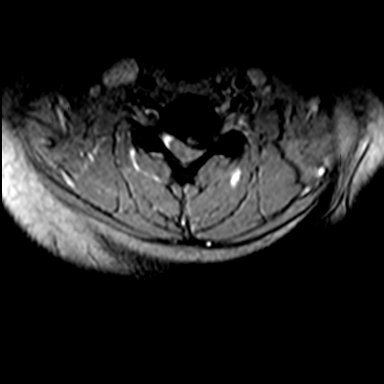
[im 17/29]
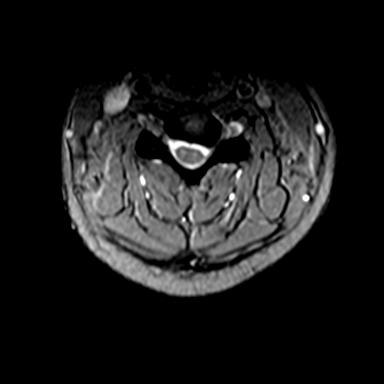
[im 19/29]
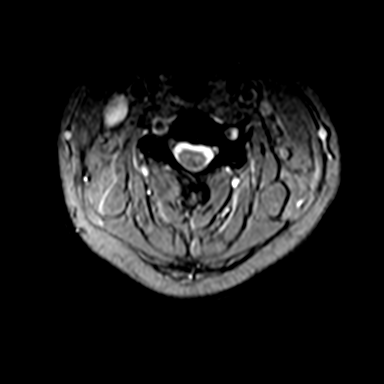

[37 of 48 positions shown; findings below may reference images not displayed]

FINDINGS: Alignment: No substantial sagittal subluxation.

Vertebrae: Vertebral body heights are maintained. No specific
evidence of acute fracture or discitis/osteomyelitis. Suspected
scattered benign vertebral venous malformations, largest at C2. No
suspicious bone lesions.

Cord: Normal cord signal.

Posterior Fossa, vertebral arteries, paraspinal tissues: Visualized
vertebral artery flow voids are maintained. The visualized posterior
fossa is unremarkable on limited sagittal assessment.

Disc levels:

C2-C3: No significant disc protrusion, foraminal stenosis, or canal
stenosis.

C3-C4: No significant disc protrusion, foraminal stenosis, or canal
stenosis.

C4-C5: Left greater than right facet and uncovertebral hypertrophy.
Resulting moderate left foraminal stenosis. No significant canal or
right foraminal stenosis.

C5-C6: Broad posterior disc osteophyte complex with superimposed
large inferiorly dissecting left paracentral/foraminal disc
protrusion with with severe stenosis of the left aspect of the canal
and medial left foramen. The cord is deformed and displaced to the
right. Moderate right foraminal stenosis.

C6-C7: Broad posterior disc osteophyte complex which contacts and
flattens the cord with mild to moderate canal stenosis. Moderate
right and mild left foraminal stenosis.

C7-T1: No significant disc protrusion, foraminal stenosis, or canal
stenosis.
IMPRESSION: 1. At C5-C6, large inferiorly dissecting left paracentral/foraminal
disc protrusion with severe stenosis of the left aspect of the canal
and left C5-C6 foramen. The cord is deformed and displaced to the
right. Moderate right foraminal stenosis.
2. At C6-C7, broad disc bulge which contacts and flattens the cord
with mild to moderate canal stenosis and moderate right and mild
left foraminal stenosis.
3. At C4-C5, moderate left foraminal stenosis.

## 2021-02-14 NOTE — Progress Notes (Signed)
Referral placed for neurosurgery per Dr Marlou Sa based on MRI report

## 2021-02-18 ENCOUNTER — Encounter: Payer: Self-pay | Admitting: Family Medicine

## 2021-02-19 ENCOUNTER — Other Ambulatory Visit: Payer: Self-pay

## 2021-02-19 NOTE — Progress Notes (Signed)
She has big disc I think you have called to arrange neurosurgical follow-up thank you

## 2021-02-20 ENCOUNTER — Other Ambulatory Visit: Payer: Self-pay

## 2021-02-20 MED ORDER — HYDROCHLOROTHIAZIDE 12.5 MG PO CAPS: 12.5000 mg | ORAL_CAPSULE | Freq: Every day | ORAL | 3 refills | Status: DC

## 2021-02-21 MED ORDER — AMLODIPINE BESYLATE 2.5 MG PO TABS: 2.5000 mg | ORAL_TABLET | Freq: Every day | ORAL | 3 refills | Status: DC

## 2021-02-28 ENCOUNTER — Encounter: Payer: Self-pay | Admitting: Physician Assistant

## 2021-02-28 ENCOUNTER — Ambulatory Visit: Payer: No Typology Code available for payment source | Admitting: Physician Assistant

## 2021-02-28 ENCOUNTER — Other Ambulatory Visit: Payer: Self-pay

## 2021-02-28 ENCOUNTER — Telehealth: Payer: Self-pay

## 2021-02-28 VITALS — BP 132/93 | HR 95 | Temp 98.0°F | Ht 68.0 in | Wt 216.2 lb

## 2021-02-28 DIAGNOSIS — Z01818 Encounter for other preprocedural examination: Secondary | ICD-10-CM | POA: Diagnosis not present

## 2021-02-28 DIAGNOSIS — I10 Essential (primary) hypertension: Secondary | ICD-10-CM | POA: Diagnosis not present

## 2021-02-28 DIAGNOSIS — M5030 Other cervical disc degeneration, unspecified cervical region: Secondary | ICD-10-CM

## 2021-02-28 NOTE — Progress Notes (Signed)
Established Patient Office Visit  Subjective:  Patient ID: [name], female    DOB: May 26, 1981  Age: 40 y.o. MRN: [id number]  CC:  Chief Complaint  Patient presents with   Pre-op Exam     HPI [name] presents for preop examination. She is hoping to schedule C5-C6, C6-C7 artificial disc replacement with Dr. Pieter Partridge Dawley as soon as next week.  She has a new history of hypertension. Recently started on Norvasc 2.5 mg and HCTZ 12.5 mg daily. BP still running in 130s-140s/90s. She also has a history of high triglycerides, currently only treated with lifestyle changes.  Not on any anticoagulants, never has been. Does not take any NSAIDs. No family hx of problems with anesthesia and patient denies any personal history of issues with anesthesia.    Past Medical History:  Diagnosis Date   Chicken pox    UTI (urinary tract infection)     Past Surgical History:  Procedure Laterality Date   CESAREAN SECTION     2002,2007,2010   TUBAL LIGATION  2010    Family History  Problem Relation Age of Onset   Asthma Mother    Diabetes Mother    Healthy Father    Kidney Stones Brother    Kidney Stones Sister    Kidney Stones Brother    Other Maternal Grandmother        27 years old in 2021    Social History   Socioeconomic History   Marital status: Married    Spouse name: Not on file   Number of children: 3   Years of education: 12   Highest education level: Not on file  Occupational History   Not on file  Tobacco Use   Smoking status: Never   Smokeless tobacco: Never  Substance and Sexual Activity   Alcohol use: No   Drug use: No   Sexual activity: Yes  Other Topics Concern   Not on file  Social History Narrative   Married. 3 daughters 106, 67, and 10 in 2021.       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       Hobbies:crafters, reading, family time, music   Social Determinants of Health   Financial Resource Strain: Not on file  Food  Insecurity: Not on file  Transportation Needs: Not on file  Physical Activity: Not on file  Stress: Not on file  Social Connections: Not on file  Intimate Partner Violence: Not on file    Outpatient Medications Prior to Visit  Medication Sig Dispense Refill   albuterol (VENTOLIN HFA) 108 (90 Base) MCG/ACT inhaler Inhale 2 puffs into the lungs every 6 (six) hours as needed for wheezing or shortness of breath. 1 each 0   amLODipine (NORVASC) 2.5 MG tablet Take 1 tablet (2.5 mg total) by mouth daily. 90 tablet 3   Drospirenone 4 MG TABS Take by mouth.     hydrochlorothiazide (MICROZIDE) 12.5 MG capsule Take 1 capsule (12.5 mg total) by mouth daily. 90 capsule 3   ibuprofen (ADVIL) 200 MG tablet Take 400-600 mg by mouth every 6 (six) hours as needed for moderate pain or headache.     No facility-administered medications prior to visit.    No Known Allergies  ROS Review of Systems  Constitutional:  Negative for activity change, appetite change, fatigue, fever and unexpected weight change.  Eyes:  Negative for visual disturbance.  Respiratory:  Negative for apnea, chest tightness and shortness of breath.  Cardiovascular:  Negative for chest pain, palpitations and leg swelling.  Gastrointestinal:  Negative for blood in stool.  Musculoskeletal:  Positive for neck pain.  Neurological:  Positive for headaches. Negative for dizziness, tremors, seizures, syncope, speech difficulty, weakness and light-headedness.  Psychiatric/Behavioral:  Negative for confusion and sleep disturbance. The patient is not nervous/anxious.      Objective:    Physical Exam Vitals and nursing note reviewed.  Constitutional:      General: She is not in acute distress.    Appearance: Normal appearance. She is normal weight.  HENT:     Head: Normocephalic.     Right Ear: External ear normal.     Left Ear: External ear normal.     Nose: Nose normal.     Mouth/Throat:     Mouth: Mucous membranes are moist.   Eyes:     Extraocular Movements: Extraocular movements intact.     Conjunctiva/sclera: Conjunctivae normal.     Pupils: Pupils are equal, round, and reactive to light.  Cardiovascular:     Rate and Rhythm: Normal rate and regular rhythm.     Pulses: Normal pulses.     Heart sounds: No murmur heard. Pulmonary:     Effort: Pulmonary effort is normal. No respiratory distress.     Breath sounds: Normal breath sounds.  Abdominal:     General: Abdomen is flat. Bowel sounds are normal.     Palpations: Abdomen is soft.     Tenderness: There is no abdominal tenderness.  Musculoskeletal:        General: Normal range of motion.     Cervical back: Normal range of motion.  Skin:    General: Skin is warm.  Neurological:     General: No focal deficit present.     Mental Status: She is alert and oriented to person, place, and time.     Cranial Nerves: No cranial nerve deficit.     Motor: No weakness.     Gait: Gait normal.  Psychiatric:        Mood and Affect: Mood normal.        Behavior: Behavior normal.    BP (!) 132/93   Pulse 95   Temp 98 F (36.7 C)   Ht 5\' 8"  (1.727 m)   Wt 216 lb 3.2 oz (98.1 kg)   SpO2 97%   BMI 32.87 kg/m  Wt Readings from Last 3 Encounters:  02/28/21 216 lb 3.2 oz (98.1 kg)  02/10/21 224 lb 3.2 oz (101.7 kg)  01/02/21 226 lb (102.5 kg)     Health Maintenance Due  Topic Date Due   PAP SMEAR-Modifier  07/06/2017    There are no preventive care reminders to display for this patient.  Lab Results  Component Value Date   TSH 3.22 01/02/2021   Lab Results  Component Value Date   WBC 6.9 01/27/2021   HGB 15.8 (H) 01/27/2021   HCT 45.7 01/27/2021   MCV 90.1 01/27/2021   PLT 252 01/27/2021   Lab Results  Component Value Date   NA 138 01/27/2021   K 3.5 01/27/2021   CO2 24 01/27/2021   GLUCOSE 109 (H) 01/27/2021   BUN 13 01/27/2021   CREATININE 0.84 01/27/2021   BILITOT 0.3 01/02/2021   ALKPHOS 74 01/02/2021   AST 29 01/02/2021   ALT 24  01/02/2021   PROT 7.7 01/02/2021   ALBUMIN 4.3 01/02/2021   CALCIUM 9.3 01/27/2021   ANIONGAP 12 01/27/2021   GFR 107.49 01/02/2021  Lab Results  Component Value Date   CHOL 184 10/27/2019   Lab Results  Component Value Date   HDL 42.80 10/27/2019   Lab Results  Component Value Date   LDLCALC 103 (H) 10/27/2019   Lab Results  Component Value Date   TRIG 188.0 (H) 10/27/2019   Lab Results  Component Value Date   CHOLHDL 4 10/27/2019   No results found for: HGBA1C    Assessment & Plan:   Problem List Items Addressed This Visit       Musculoskeletal and Integument   DISC DISEASE, CERVICAL   Other Visit Diagnoses     Preop examination    -  Primary   Relevant Orders   EKG 12-Lead (Completed)   Essential hypertension       Relevant Orders   EKG 12-Lead (Completed)       No orders of the defined types were placed in this encounter.   Follow-up: No follow-ups on file.   1. Preop examination 2. Essential hypertension 3. DISC DISEASE, CERVICAL I discussed patient's case with her PCP, Dr. Yong Channel today.  Her blood pressure is still elevated despite treatment with amlodipine 2.5 mg and HCTZ 12.5 mg.  It was recommended to her today to increase the amlodipine to 5 mg.  She needs to get closer to goal of 120/80.  Her EKG showed normal sinus rhythm without any ST or T wave changes.  Unfortunately, based on her last visit with Dr. Yong Channel, she will still need cardiac clearance before we are able to completely clear her for the surgery for her cervical disc repair as she was complaining of chest pain with exertion around that visit on 02/10/2021.  She does have an appointment with cardiology in September.   Darnetta Kesselman M Aveleen Nevers, PA-C

## 2021-02-28 NOTE — Telephone Encounter (Signed)
Spoke with patient notified Referral to Cardiology is booked out until October they have her on the waiting list is voices understanding.

## 2021-03-03 ENCOUNTER — Telehealth: Payer: Self-pay

## 2021-03-03 DIAGNOSIS — B019 Varicella without complication: Secondary | ICD-10-CM | POA: Insufficient documentation

## 2021-03-03 NOTE — Telephone Encounter (Signed)
Pt ha appt 03/04/21 with Dr. Agustin Cree. Will forward clearance notes to MD for upcoming appt. Will send FYI to surgeon's office pt has appt 03/04/21.

## 2021-03-03 NOTE — Telephone Encounter (Signed)
   [facility] Pre-operative Risk Assessment    Request for surgical clearance:  What type of surgery is being performed? Artificial Disc Replacement   When is this surgery scheduled? TBD   What type of clearance is required (medical clearance vs. Pharmacy clearance to hold med vs. Both)? Both  Are there any medications that need to be held prior to surgery and how long?Not specified  Practice name and name of physician performing surgery? Dr. Elwin Sleight at [facility]    What is your office phone number: [phone] ext [phone]    7.   What is your office fax number:n [phone]  8.   Anesthesia type (None, local, MAC, general) ? General   Note: Patient is schedule to see Dr. Agustin Cree on 03/04/21   [name] 03/03/2021, 4:06 PM  _________________________________________________________________   (provider comments below)

## 2021-03-03 NOTE — Telephone Encounter (Signed)
Primary Cardiologist:None  Chart reviewed as part of pre-operative protocol coverage. Because of [name]'s past medical history and time since last visit, he/she will require a follow-up visit in order to better assess preoperative cardiovascular risk.  Pre-op covering staff: - Please schedule appointment and call patient to inform them. - Please contact requesting surgeon's office via preferred method (i.e, phone, fax) to inform them of need for appointment prior to surgery.  If applicable, this message will also be routed to pharmacy pool and/or primary cardiologist for input on holding anticoagulant/antiplatelet agent as requested below so that this information is available at time of patient's appointment.   Mary Pelton, NP  03/03/2021, 4:38 PM

## 2021-03-04 ENCOUNTER — Other Ambulatory Visit: Payer: Self-pay

## 2021-03-04 ENCOUNTER — Ambulatory Visit (HOSPITAL_COMMUNITY): Payer: No Typology Code available for payment source | Attending: Cardiology

## 2021-03-04 ENCOUNTER — Ambulatory Visit: Payer: No Typology Code available for payment source | Admitting: Cardiology

## 2021-03-04 ENCOUNTER — Encounter: Payer: Self-pay | Admitting: Cardiology

## 2021-03-04 VITALS — BP 120/84 | HR 88 | Ht 68.0 in | Wt 218.0 lb

## 2021-03-04 DIAGNOSIS — I10 Essential (primary) hypertension: Secondary | ICD-10-CM

## 2021-03-04 DIAGNOSIS — R0789 Other chest pain: Secondary | ICD-10-CM | POA: Diagnosis not present

## 2021-03-04 DIAGNOSIS — E782 Mixed hyperlipidemia: Secondary | ICD-10-CM

## 2021-03-04 DIAGNOSIS — R072 Precordial pain: Secondary | ICD-10-CM | POA: Diagnosis not present

## 2021-03-04 LAB — BASIC METABOLIC PANEL
BUN/Creatinine Ratio: 17 (ref 9–23)
BUN: 15 mg/dL (ref 6–24)
CO2: 23 mmol/L (ref 20–29)
Calcium: 9.5 mg/dL (ref 8.7–10.2)
Chloride: 99 mmol/L (ref 96–106)
Creatinine, Ser: 0.87 mg/dL (ref 0.57–1.00)
Glucose: 96 mg/dL (ref 65–99)
Potassium: 3.7 mmol/L (ref 3.5–5.2)
Sodium: 139 mmol/L (ref 134–144)
eGFR: 86 mL/min/{1.73_m2} (ref >59)

## 2021-03-04 LAB — ECHOCARDIOGRAM COMPLETE
Area-P 1/2: 2.62 cm2
Height: 68 in
S' Lateral: 3.05 cm
Weight: 3488 oz

## 2021-03-04 MED ORDER — METOPROLOL TARTRATE 100 MG PO TABS: ORAL_TABLET | ORAL | 0 refills | Status: DC

## 2021-03-04 NOTE — Patient Instructions (Addendum)
Medication Instructions:  Your physician recommends that you continue on your current medications as directed. Please refer to the Current Medication list given to you today.  *If you need a refill on your cardiac medications before your next appointment, please call your pharmacy*   Lab Work: Your physician recommends that you return for lab work in:  3-7 days before CT scan: BMET  If you have labs (blood work) drawn today and your tests are completely normal, you will receive your results only by: [place] (if you have MyChart) OR A paper copy in the mail If you have any lab test that is abnormal or we need to change your treatment, we will call you to review the results.   Testing/Procedures: Your physician has requested that you have an echocardiogram. Echocardiography is a painless test that uses sound waves to create images of your heart. It provides your doctor with information about the size and shape of your heart and how well your heart's chambers and valves are working. This procedure takes approximately one hour. There are no restrictions for this procedure.    Your cardiac CT will be scheduled at one of the below locations:   [facility] [address] [place], [place] [address] [phone]   If scheduled at [facility], please arrive at the [facility] main entrance (entrance A) of [facility] 30 minutes prior to test start time. Proceed to the [facility] Radiology Department (first floor) to check-in and test prep.    Please follow these instructions carefully (unless otherwise directed):   On the Night Before the Test: Be sure to Drink plenty of water. Do not consume any caffeinated/decaffeinated beverages or chocolate 12 hours prior to your test. Do not take any antihistamines 12 hours prior to your test.  On the Day of the Test: Drink plenty of water until 1 hour prior to the test. Do not eat any food 4 hours prior to  the test. You may take your regular medications prior to the test.  Take metoprolol (Lopressor) two hours prior to test. HOLD Hydrochlorothiazide morning of the test. FEMALES- please wear underwire-free bra if available, avoid dresses & tight clothing       After the Test: Drink plenty of water. After receiving IV contrast, you may experience a mild flushed feeling. This is normal. On occasion, you may experience a mild rash up to 24 hours after the test. This is not dangerous. If this occurs, you can take Benadryl 25 mg and increase your fluid intake. If you experience trouble breathing, this can be serious. If it is severe call 911 IMMEDIATELY. If it is mild, please call our office. If you take any of these medications: Glipizide/Metformin, Avandament, Glucavance, please do not take 48 hours after completing test unless otherwise instructed.  Please allow 2-4 weeks for scheduling of routine cardiac CTs. Some insurance companies require a pre-authorization which may delay scheduling of this test.   For non-scheduling related questions, please contact the cardiac imaging nurse navigator should you have any questions/concerns: Marchia Bond, Cardiac Imaging Nurse Navigator Gordy Clement, Cardiac Imaging Nurse Navigator [facility] Direct Office Dial: [phone]   For scheduling needs, including cancellations and rescheduling, please call [place], [phone].    Follow-Up: At [facility], you and your health needs are our priority.  As part of our continuing mission to provide you with exceptional heart care, we have created designated Provider Care Teams.  These Care  Teams include your primary Cardiologist (physician) and Advanced Practice Providers (APPs -  Physician Assistants and Nurse Practitioners) who all work together to provide you with the care you need, when you need it.  We recommend signing up for the patient portal called "MyChart".  Sign up  information is provided on this After Visit Summary.  MyChart is used to connect with patients for Virtual Visits (Telemedicine).  Patients are able to view lab/test results, encounter notes, upcoming appointments, etc.  Non-urgent messages can be sent to your provider as well.   To learn more about what you can do with MyChart, go to [email or web address].    Your next appointment:   2 month(s)  The format for your next appointment:   In Person  Provider:   Jenne Campus, MD   Other Instructions: Echocardiogram An echocardiogram is a test that uses sound waves (ultrasound) to produce images of the heart. Images from an echocardiogram can provide important information about: Heart size and shape. The size and thickness and movement of your heart's walls. Heart muscle function and strength. Heart valve function or if you have stenosis. Stenosis is when the heart valves are too narrow. If blood is flowing backward through the heart valves (regurgitation). A tumor or infectious growth around the heart valves. Areas of heart muscle that are not working well because of poor blood flow or injury from a heart attack. Aneurysm detection. An aneurysm is a weak or damaged part of an artery wall. The wall bulges out from the normal force of blood pumping through the body. Tell a health care provider about: Any allergies you have. All medicines you are taking, including vitamins, herbs, eye drops, creams, and over-the-counter medicines. Any blood disorders you have. Any surgeries you have had. Any medical conditions you have. Whether you are pregnant or may be pregnant. What are the risks? Generally, this is a safe test. However, problems may occur, including an allergic reaction to dye (contrast) that may be used during the test. What happens before the test? No specific preparation is needed. You may eat and drink normally. What happens during the test?  You will take off your  clothes from the waist up and put on a hospital gown. Electrodes or electrocardiogram (ECG)patches may be placed on your chest. The electrodes or patches are then connected to a device that monitors your heart rate and rhythm. You will lie down on a table for an ultrasound exam. A gel will be applied to your chest to help sound waves pass through your skin. A handheld device, called a transducer, will be pressed against your chest and moved over your heart. The transducer produces sound waves that travel to your heart and bounce back (or "echo" back) to the transducer. These sound waves will be captured in real-time and changed into images of your heart that can be viewed on a video monitor. The images will be recorded on a computer and reviewed by your health care provider. You may be asked to change positions or hold your breath for a short time. This makes it easier to get different views or better views of your heart. In some cases, you may receive contrast through an IV in one of your veins. This can improve the quality of the pictures from your heart. The procedure may vary among health care providers and hospitals. What can I expect after the test? You may return to your normal, everyday life, including diet, activities, andmedicines, unless your health care provider  tells you not to do that. Follow these instructions at home: It is up to you to get the results of your test. Ask your health care provider, or the department that is doing the test, when your results will be ready. Keep all follow-up visits. This is important. Summary An echocardiogram is a test that uses sound waves (ultrasound) to produce images of the heart. Images from an echocardiogram can provide important information about the size and shape of your heart, heart muscle function, heart valve function, and other possible heart problems. You do not need to do anything to prepare before this test. You may eat and drink  normally. After the echocardiogram is completed, you may return to your normal, everyday life, unless your health care provider tells you not to do that. This information is not intended to replace advice given to you by your health care provider. Make sure you discuss any questions you have with your healthcare provider. Document Revised: 03/26/2020 Document Reviewed: 03/26/2020 Elsevier Patient Education  2022 [employer].

## 2021-03-04 NOTE — Progress Notes (Signed)
Cardiology Consultation:    Date:  03/04/2021   ID:  [name], DOB 24-Jul-1981, MRN [id number]  PCP:  Marin Olp, MD  Cardiologist:  Jenne Campus, MD   Referring MD: Marin Olp, MD   Chief Complaint  Patient presents with   Clearance TBD    Dr. Gomez Cleverly for Artificial Disc Replacement. Please forward clearance to Dr. Yong Channel PCP    History of Present Illness:    [name] is a 40 y.o. female who is being seen today for the evaluation of atypical chest pain at the request of Marin Olp, MD. lady with past medical history significant for essential hypertension that required multiple medications for management, dyslipidemia, chronic neck problem and she is actually is scheduled to have a neck surgery.  Recently she ended up going to the emergency room because of episodes of headache.  She was found to have abnormal EKG and referred to [place].  She also reported to have some chest pains.  She described this pain as easy sensation greatest in about 5-6, it is associated with some shortness of breath there is no sweating that sensation can last up to 1 hour.  It happened at different situations usually not with exercise.  Her EKG show poor R wave progression anterior precordium rising suspicion for anterior septal wall myocardial infarction.  She does not have family history of premature coronary artery disease, she does not exercise on the regular basis, she is not on any special diet.  She does have palpitations swelling of lower extremities.  She noticed that she gets tired much more easily than before.  Past Medical History:  Diagnosis Date   Chicken pox    UTI (urinary tract infection)     Past Surgical History:  Procedure Laterality Date   CESAREAN SECTION     2002,2007,2010   TUBAL LIGATION  2010    Current Medications: Current Meds  Medication Sig   albuterol (VENTOLIN HFA) 108 (90 Base) MCG/ACT inhaler Inhale 2 puffs into the lungs every 6  (six) hours as needed for wheezing or shortness of breath.   amLODipine (NORVASC) 2.5 MG tablet Take 1 tablet (2.5 mg total) by mouth daily.   Drospirenone 4 MG TABS Take 1 tablet by mouth daily.   hydrochlorothiazide (MICROZIDE) 12.5 MG capsule Take 1 capsule (12.5 mg total) by mouth daily.   ibuprofen (ADVIL) 200 MG tablet Take 400-600 mg by mouth every 6 (six) hours as needed for moderate pain or headache.     Allergies:   Patient has no known allergies.   Social History   Socioeconomic History   Marital status: Married    Spouse name: Not on file   Number of children: 3   Years of education: 12   Highest education level: Not on file  Occupational History   Not on file  Tobacco Use   Smoking status: Never   Smokeless tobacco: Never  Substance and Sexual Activity   Alcohol use: No   Drug use: No   Sexual activity: Yes  Other Topics Concern   Not on file  Social History Narrative   Married. 3 daughters 57, 50, and 10 in 2021.       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       Hobbies:crafters, reading, family time, music   Social Determinants of Health   Financial Resource Strain: Not on file  Food Insecurity: Not on file  Transportation Needs: Not  on file  Physical Activity: Not on file  Stress: Not on file  Social Connections: Not on file     Family History: The patient's family history includes Asthma in her mother; Diabetes in her mother; Healthy in her father; Kidney Stones in her brother, brother, and sister; Other in her maternal grandmother. ROS:   Please see the history of present illness.    All 14 point review of systems negative except as described per history of present illness.  EKGs/Labs/Other Studies Reviewed:    The following studies were reviewed today: I did review data from the emergency room visit troponins were negativ  EKG:  EKG is  ordered today.  The ekg ordered today demonstrates normal sinus rhythm normal P interval pull R  wave progression.  Precordium  Recent Labs: 01/02/2021: ALT 24; TSH 3.22 01/27/2021: BUN 13; Creatinine, Ser 0.84; Hemoglobin 15.8; Platelets 252; Potassium 3.5; Sodium 138  Recent Lipid Panel    Component Value Date/Time   CHOL 184 10/27/2019 0926   TRIG 188.0 (H) 10/27/2019 0926   HDL 42.80 10/27/2019 0926   CHOLHDL 4 10/27/2019 0926   VLDL 37.6 10/27/2019 0926   LDLCALC 103 (H) 10/27/2019 0926    Physical Exam:    VS:  BP 120/84 (BP Location: Left Arm, Patient Position: Sitting)   Pulse 88   Ht 5\' 8"  (1.727 m)   Wt 218 lb (98.9 kg)   SpO2 96%   BMI 33.15 kg/m     Wt Readings from Last 3 Encounters:  03/04/21 218 lb (98.9 kg)  02/28/21 216 lb 3.2 oz (98.1 kg)  02/10/21 224 lb 3.2 oz (101.7 kg)     GEN:  Well nourished, well developed in no acute distress HEENT: Normal NECK: No JVD; No carotid bruits LYMPHATICS: No lymphadenopathy CARDIAC: RRR, no murmurs, no rubs, no gallops RESPIRATORY:  Clear to auscultation without rales, wheezing or rhonchi  ABDOMEN: Soft, non-tender, non-distended MUSCULOSKELETAL:  No edema; No deformity  SKIN: Warm and dry NEUROLOGIC:  Alert and oriented x 3 PSYCHIATRIC:  Normal affect   ASSESSMENT:    1. Atypical chest pain   2. Mixed hyperlipidemia   3. Essential hypertension    PLAN:    In order of problems listed above:  Atypical chest pain in this lady with risk factors for coronary artery disease.  We did discuss option in this situation I think we need to check her for coronary artery disease.  I think the best approach will be to do coronary CT angio to assess if there are any evidence of atherosclerosis within coronary arteries, however there is some pressure with time she required neck surgery.  If we will not be able to get approval of her coronary CT angio within the next few weeks then we will proceed with stress testing in form of [place].  Benefits of coronary CT angio would be to look at morphology of coronary arteries and  more specifically look for nonobstructive coronary artery disease with does carry significant prognostic implications.  Sadly from a stress test we will not be able to get dosing formations. Essential hypertension.  Blood pressure seems to well controlled today.  I will schedule her to have echocardiogram to assess left ventricle hypertrophy.  In the meantime we will continue present management. Dyslipidemia we will wait for results of coronary CT angio/stress test to decide about aggressiveness of management of this problem. Cardiovascular preop evaluation in my opinion she required echocardiogram as well as evaluation for coronary artery  disease before the procedure hopefully will be able to do it within a week or 2.   Medication Adjustments/Labs and Tests Ordered: Current medicines are reviewed at length with the patient today.  Concerns regarding medicines are outlined above.  No orders of the defined types were placed in this encounter.  No orders of the defined types were placed in this encounter.   Signed, Park Liter, MD, [facility]. 03/04/2021 8:59 AM    [place]

## 2021-03-06 ENCOUNTER — Telehealth: Payer: Self-pay

## 2021-03-06 ENCOUNTER — Ambulatory Visit: Payer: No Typology Code available for payment source | Admitting: Physician Assistant

## 2021-03-06 MED ORDER — AMLODIPINE BESYLATE 2.5 MG PO TABS: 2.5000 mg | ORAL_TABLET | Freq: Every day | ORAL | 1 refills | Status: DC

## 2021-03-06 NOTE — Telephone Encounter (Signed)
  LAST APPOINTMENT DATE:  02/28/21  NEXT APPOINTMENT DATE:@7 /29/2022  MEDICATION:amLODipine (NORVASC) 2.5 MG tablet   PHARMACY:[id number] [place], [place] - [address] AT [place]

## 2021-03-06 NOTE — Telephone Encounter (Signed)
Spoke to pt told her Rx Amlodipine was sent to [facility] on 7/8 as requested. Pt said yes but they only gave her a 30 day supply and needs to change pharmacy in order to get 90 day supply and insurance to pay.Told her okay will [facility]. Pt verbalized understanding. Rx sent.

## 2021-03-07 ENCOUNTER — Other Ambulatory Visit: Payer: Self-pay

## 2021-03-07 ENCOUNTER — Telehealth (HOSPITAL_COMMUNITY): Payer: Self-pay | Admitting: *Deleted

## 2021-03-07 ENCOUNTER — Telehealth (HOSPITAL_COMMUNITY): Payer: Self-pay | Admitting: Emergency Medicine

## 2021-03-07 NOTE — Telephone Encounter (Signed)
Returning patient's call regarding upcoming cardiac imaging study; pt verbalizes understanding of appt date/time, parking situation and where to check in, pre-test NPO status and medications ordered, and verified current allergies; name and call back number provided for further questions should they arise  Gordy Clement RN Navigator Cardiac Imaging [facility] [phone] office [phone] cell  Patient to take '100mg'$  metoprolol tartrate 2 hours prior to cardiac CT scan.

## 2021-03-07 NOTE — Telephone Encounter (Signed)
Attempted to call patient regarding upcoming cardiac CT appointment. °Left message on voicemail with name and callback number °Merissa Renwick RN Navigator Cardiac Imaging °[facility] °[phone] Office °[phone] Cell ° °

## 2021-03-10 ENCOUNTER — Encounter (HOSPITAL_COMMUNITY): Payer: Self-pay

## 2021-03-10 ENCOUNTER — Other Ambulatory Visit (HOSPITAL_COMMUNITY): Payer: Self-pay | Admitting: Cardiology

## 2021-03-10 ENCOUNTER — Ambulatory Visit (HOSPITAL_COMMUNITY)
Admission: RE | Admit: 2021-03-10 | Discharge: 2021-03-10 | Disposition: A | Discharge status: Home / Self Care | Payer: No Typology Code available for payment source | Source: Ambulatory Visit | Attending: Cardiology | Admitting: Cardiology

## 2021-03-10 ENCOUNTER — Other Ambulatory Visit: Payer: Self-pay

## 2021-03-10 DIAGNOSIS — R072 Precordial pain: Secondary | ICD-10-CM

## 2021-03-10 DIAGNOSIS — Z789 Other specified health status: Secondary | ICD-10-CM | POA: Diagnosis present

## 2021-03-10 DIAGNOSIS — I2510 Atherosclerotic heart disease of native coronary artery without angina pectoris: Secondary | ICD-10-CM | POA: Diagnosis not present

## 2021-03-10 IMAGING — CT CT HEART MORP W/ CTA COR W/ SCORE W/ CA W/CM &/OR W/O CM
4 of 7 series · 8 of 20 positions shown, 9 images · non-contrast
Comparison: None.
COMPARISON: None.

Addendum:
EXAM:
OVER-READ INTERPRETATION  CT CHEST

The following report is an over-read performed by radiologist Dr.
NYLA [REDACTED] on [DATE]. This over-read
does not include interpretation of cardiac or coronary anatomy or
pathology. The coronary CTA interpretation by the cardiologist is
attached.
CLINICAL DATA: CP with risk factors
Cardiac/Coronary  CTA
TECHNIQUE: The patient was scanned on a Phillips Force scanner.

[Series 6: best diast 72 % · axial · 0.39mm/px · z∈[+1214,+1252]mm · 2 of 286 slices shown]
[im 96/286  vessel]
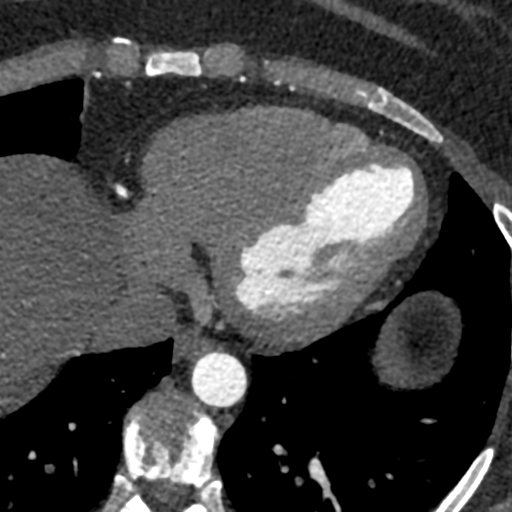
[im 191/286  vessel]
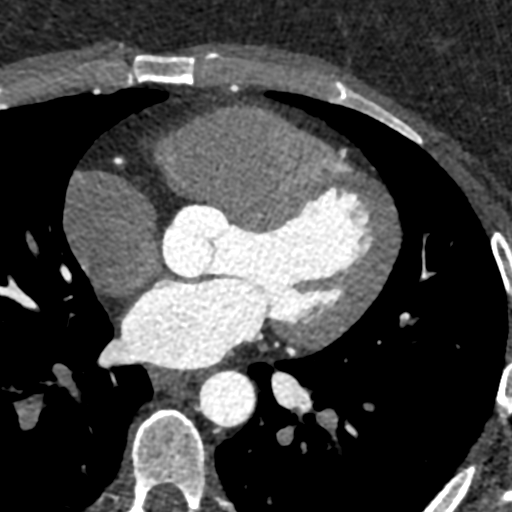

[Series 7: best syst · axial · 0.39mm/px · z∈[+1214,+1252]mm · 2 of 286 slices shown, 3 images]
[im 96/286  vessel]
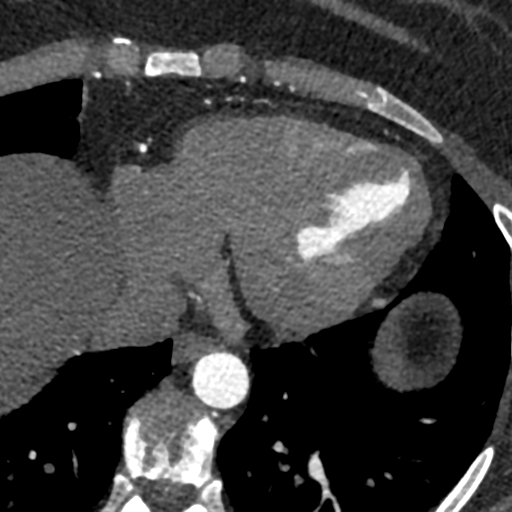
[im 96/286  lung]
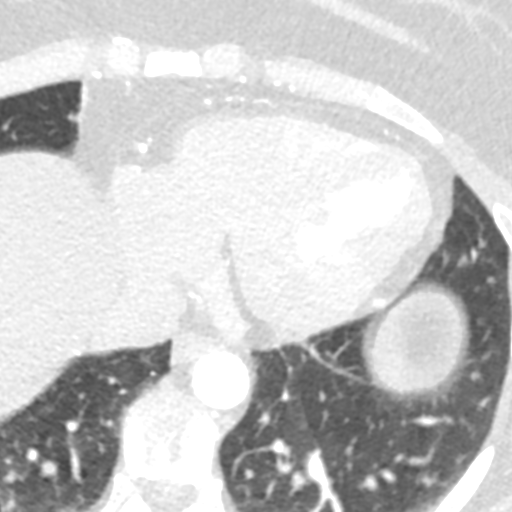
[im 191/286  vessel]
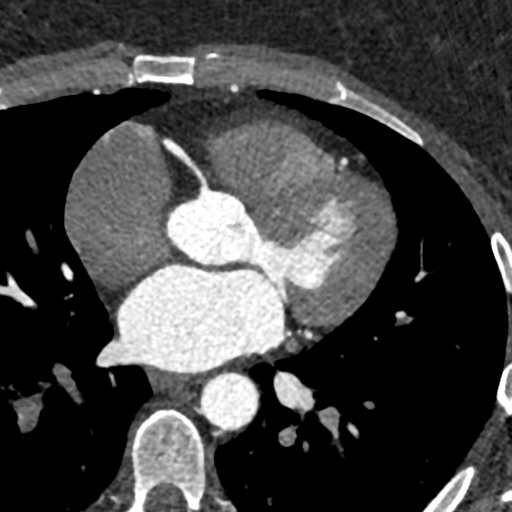

[Series 8: ts diast sharp 72 % · axial · 0.39mm/px · z∈[+1214,+1252]mm · 2 of 286 slices shown]
[im 96/286  lung]
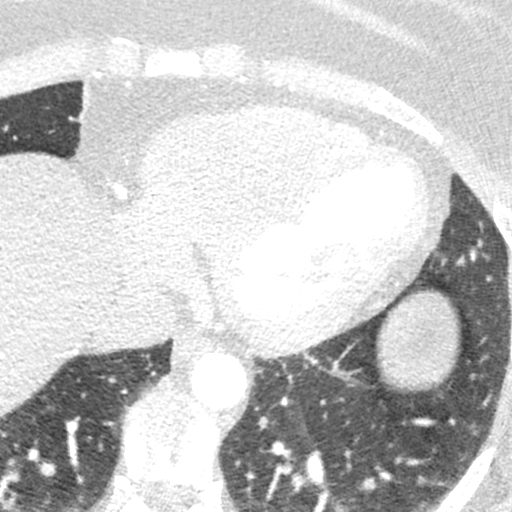
[im 191/286  lung]
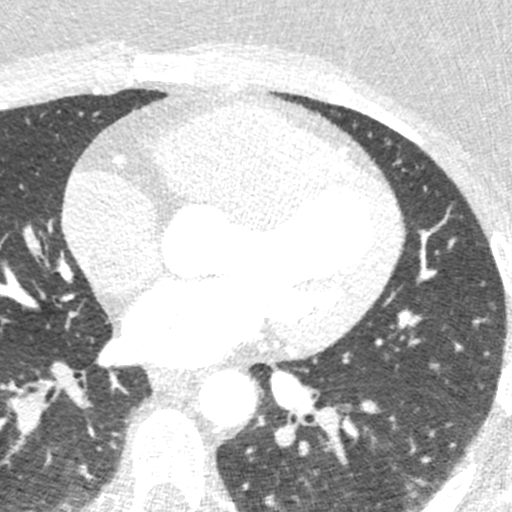

[Series 9: ts syst sharp · axial · 0.39mm/px · z∈[+1214,+1252]mm · 2 of 286 slices shown]
[im 96/286  lung]
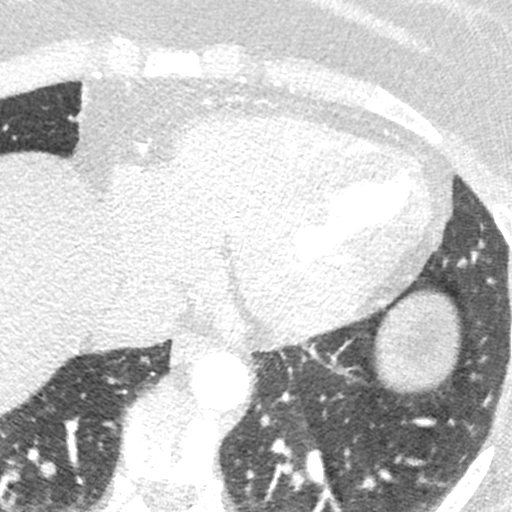
[im 191/286  lung]
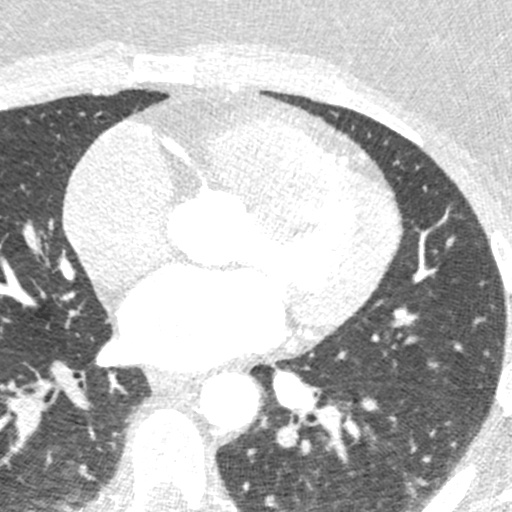

[8 of 20 positions shown; findings below may reference images not displayed]

FINDINGS: Vascular: Heart is normal size.  Aorta normal caliber.

Mediastinum/Nodes: No adenopathy

Lungs/Pleura: No confluent opacities or effusions.

Upper Abdomen: Insert for abdomen

Musculoskeletal: Chest wall soft tissues are unremarkable. No acute
bony abnormality.
IMPRESSION: No acute or significant extracardiac abnormality.
FINDINGS: A 110 kV prospective scan was triggered in the descending thoracic
aorta at 111 HU's. Axial non-contrast 3 mm slices were carried out
through the heart. The data set was analyzed on a dedicated work
station and scored using the Agatson method. Gantry rotation speed
was 250 msecs and collimation was .6 mm. No beta blockade and 0.8 mg
of sl NTG was given. The 3D data set was reconstructed in 5%
intervals of the 67-82 % of the R-R cycle. Diastolic phases were
analyzed on a dedicated work station using MPR, MIP and VRT modes.
The patient received 80 cc of contrast.

Aorta:  Normal size.  No calcifications.  No dissection.

Aortic Valve:  Trileaflet.  No calcifications.

Coronary Arteries:  Normal coronary origin.  Right dominance.

RCA is a large dominant artery that gives rise to PDA and PLA. There
is no plaque.

Left main is a large artery that gives rise to LAD and LCX arteries.

LAD is a large vessel that minimal non obstructive disease. LAD
gives rise to moderate size D1 and moderate size D2.

LCX is a non-dominant artery that gives rise to small OM1 and large
OM2 branches. There is no plaque.

Other findings:

Normal pulmonary vein drainage into the left atrium.

Normal left atrial appendage without a thrombus.

Normal size of the pulmonary artery.
IMPRESSION: 1. Coronary calcium score of 0.38.

2. Normal coronary origin with right dominance.

3. CAD-RADS 1. Minimal non-obstructive CAD (0-24%). Consider
non-atherosclerotic causes of chest pain. Consider preventive
therapy and risk factor modification.

*** End of Addendum ***
EXAM:
OVER-READ INTERPRETATION  CT CHEST

The following report is an over-read performed by radiologist Dr.
NYLA [REDACTED] on [DATE]. This over-read
does not include interpretation of cardiac or coronary anatomy or
pathology. The coronary CTA interpretation by the cardiologist is
attached.
FINDINGS: Vascular: Heart is normal size.  Aorta normal caliber.

Mediastinum/Nodes: No adenopathy

Lungs/Pleura: No confluent opacities or effusions.

Upper Abdomen: Insert for abdomen

Musculoskeletal: Chest wall soft tissues are unremarkable. No acute
bony abnormality.
IMPRESSION: No acute or significant extracardiac abnormality.

## 2021-03-10 MED ORDER — IOHEXOL 350 MG/ML SOLN
80.0000 mL | Freq: Once | INTRAVENOUS | Status: AC | PRN
  Administered 2021-03-10: 80 mL via INTRAVENOUS

## 2021-03-10 MED ORDER — NITROGLYCERIN 0.4 MG SL SUBL
0.8000 mg | SUBLINGUAL_TABLET | Freq: Once | SUBLINGUAL | Status: AC
  Administered 2021-03-10: 0.8 mg via SUBLINGUAL

## 2021-03-10 MED ORDER — DILTIAZEM HCL 25 MG/5ML IV SOLN
INTRAVENOUS | Status: AC
  Filled 2021-03-10: qty 5

## 2021-03-10 MED ORDER — METOPROLOL TARTRATE 5 MG/5ML IV SOLN
5.0000 mg | INTRAVENOUS | Status: DC | PRN
  Administered 2021-03-10 (×3): 5 mg via INTRAVENOUS

## 2021-03-10 MED ORDER — METOPROLOL TARTRATE 5 MG/5ML IV SOLN
INTRAVENOUS | Status: AC
  Filled 2021-03-10: qty 5

## 2021-03-10 MED ORDER — METOPROLOL TARTRATE 5 MG/5ML IV SOLN
INTRAVENOUS | Status: AC
  Filled 2021-03-10: qty 10

## 2021-03-10 MED ORDER — DILTIAZEM HCL 25 MG/5ML IV SOLN
10.0000 mg | Freq: Once | INTRAVENOUS | Status: AC
  Administered 2021-03-10: 10 mg via INTRAVENOUS

## 2021-03-10 MED ORDER — NITROGLYCERIN 0.4 MG SL SUBL
SUBLINGUAL_TABLET | SUBLINGUAL | Status: AC
  Filled 2021-03-10: qty 2

## 2021-03-10 NOTE — Progress Notes (Signed)
CT scan completed. Tolerated well. D/C home ambulatory with husband. Awake and alert. In no distress. 

## 2021-03-11 ENCOUNTER — Ambulatory Visit: Payer: No Typology Code available for payment source | Admitting: Physician Assistant

## 2021-03-12 ENCOUNTER — Telehealth: Payer: Self-pay | Admitting: Cardiology

## 2021-03-12 ENCOUNTER — Telehealth: Payer: Self-pay

## 2021-03-12 ENCOUNTER — Ambulatory Visit (HOSPITAL_COMMUNITY): Payer: No Typology Code available for payment source

## 2021-03-12 NOTE — Telephone Encounter (Signed)
Follow Up:    Pt said she was told by her surgeon's oggice to call and check on the status of her clearance. They said the have not received it. Please fax asap to [phone].

## 2021-03-12 NOTE — Telephone Encounter (Signed)
Called and lm for the surgery coordinator tcb.

## 2021-03-12 NOTE — Telephone Encounter (Signed)
   [facility] Pre-operative Risk Assessment    Patient Name: [name]  DOB: 1980/11/19 MRN: [id number]  HEARTCARE STAFF:  - IMPORTANT!!!!!! Under Visit Info/Reason for Call, type in Other and utilize the format Clearance MM/DD/YY or Clearance TBD. Do not use dashes or single digits. - Please review there is not already an duplicate clearance open for this procedure. - If request is for dental extraction, please clarify the # of teeth to be extracted. - If the patient is currently at the dentist's office, call Pre-Op Callback Staff (MA/nurse) to input urgent request.  - If the patient is not currently in the dentist office, please route to the Pre-Op pool.  Request for surgical clearance:  What type of surgery is being performed? Artificial Disc Replacement, Cervical  When is this surgery scheduled? TBD  What type of clearance is required (medical clearance vs. Pharmacy clearance to hold med vs. Both)? Both  Are there any medications that need to be held prior to surgery and how long? None  Practice name and name of physician performing surgery? [facility]; Dr. Pieter Partridge Dawley  What is the office phone number? [phone] ext [phone]   7.   What is the office fax number? [phone]  8.   Anesthesia type (None, local, MAC, general) ? General    Orvan July 03/12/2021, 5:29 PM  _________________________________________________________________   (provider comments below)

## 2021-03-13 ENCOUNTER — Other Ambulatory Visit: Payer: Self-pay | Admitting: Neurological Surgery

## 2021-03-13 ENCOUNTER — Ambulatory Visit: Payer: No Typology Code available for payment source | Admitting: Physician Assistant

## 2021-03-13 ENCOUNTER — Ambulatory Visit: Payer: Self-pay | Admitting: Neurology

## 2021-03-13 NOTE — Telephone Encounter (Signed)
   Primary Cardiologist: Jenne Campus, MD  Chart reviewed as part of pre-operative protocol coverage. Given past medical history and time since last visit, based on ACC/AHA guidelines, [name] would be at acceptable risk for the planned procedure without further cardiovascular testing.   I will route this recommendation to the requesting party via Epic fax function and remove from pre-op pool.  Please call with questions.  Jossie Ng. Zarielle Cea NP-C    03/13/2021, 10:19 AM [place] [address] Office [phone] Fax [phone]

## 2021-03-14 ENCOUNTER — Ambulatory Visit: Payer: No Typology Code available for payment source | Admitting: Physician Assistant

## 2021-03-14 ENCOUNTER — Other Ambulatory Visit: Payer: Self-pay | Admitting: Neurological Surgery

## 2021-03-20 NOTE — Telephone Encounter (Signed)
[name], has this been done?

## 2021-03-24 ENCOUNTER — Ambulatory Visit: Payer: No Typology Code available for payment source | Admitting: Family Medicine

## 2021-03-24 NOTE — Pre-Procedure Instructions (Signed)
Surgical Instructions    Your procedure is scheduled on Friday, August 12th, 2022.  Report to [facility] Main Entrance "A" at 05:30 A.M., then check in with the Admitting office.  Call this number if you have problems the morning of surgery:  [phone]   If you have any questions prior to your surgery date call [phone]: Open Monday-Friday 8am-4pm    Remember:  Do not eat or drink after midnight the night before your surgery    Take these medicines the morning of surgery with A SIP OF WATER  amLODipine (NORVASC) Drospirenone   As of today, STOP taking any Aspirin (unless otherwise instructed by your surgeon) Aleve, Naproxen, Ibuprofen, Motrin, Advil, Goody's, BC's, all herbal medications, fish oil, and all vitamins.          Do not wear jewelry or makeup Do not wear lotions, powders, perfumes, or deodorant. Do not shave 48 hours prior to surgery.  Do not bring valuables to the hospital. DO Not wear nail polish, gel polish, artificial nails, or any other type of covering on  natural nails including finger and toenails. If patients have artificial nails, gel coating, etc. that need to be removed by a nail salon please have this removed prior to surgery or surgery may need to be canceled/delayed if the surgeon/ anesthesia feels like the patient is unable to be adequately monitored.             [facility] is not responsible for any belongings or valuables.  Do NOT Smoke (Tobacco/Vaping) or drink Alcohol 24 hours prior to your procedure If you use a CPAP at night, you may bring all equipment for your overnight stay.   Contacts, glasses, dentures or bridgework may not be worn into surgery, please bring cases for these belongings   For patients admitted to the hospital, discharge time will be determined by your treatment team.   Patients discharged the day of surgery will not be allowed to drive home, and someone needs to stay with them for 24 hours.  ONLY 1 SUPPORT PERSON MAY  BE PRESENT WHILE YOU ARE IN SURGERY. IF YOU ARE TO BE ADMITTED ONCE YOU ARE IN YOUR ROOM YOU WILL BE ALLOWED TWO (2) VISITORS.  Minor children may have two parents present. Special consideration for safety and communication needs will be reviewed on a case by case basis.  Special instructions:    Oral Hygiene is also important to reduce your risk of infection.  Remember - BRUSH YOUR TEETH THE MORNING OF SURGERY WITH YOUR REGULAR TOOTHPASTE   [facility]- Preparing For Surgery  Before surgery, you can play an important role. Because skin is not sterile, your skin needs to be as free of germs as possible. You can reduce the number of germs on your skin by washing with CHG (chlorahexidine gluconate) Soap before surgery.  CHG is an antiseptic cleaner which kills germs and bonds with the skin to continue killing germs even after washing.     Please do not use if you have an allergy to CHG or antibacterial soaps. If your skin becomes reddened/irritated stop using the CHG.  Do not shave (including legs and underarms) for at least 48 hours prior to first CHG shower. It is OK to shave your face.  Please follow these instructions carefully.     Shower the NIGHT BEFORE SURGERY and the MORNING OF SURGERY with CHG Soap.   If you chose to wash your hair, wash your hair first as usual with  your normal shampoo. After you shampoo, rinse your hair and body thoroughly to remove the shampoo.  Then [employer] and genitals (private parts) with your normal soap and rinse thoroughly to remove soap.  After that Use CHG Soap as you would any other liquid soap. You can apply CHG directly to the skin and wash gently with a scrungie or a clean washcloth.   Apply the CHG Soap to your body ONLY FROM THE NECK DOWN.  Do not use on open wounds or open sores. Avoid contact with your eyes, ears, mouth and genitals (private parts). Wash Face and genitals (private parts)  with your normal soap.   Wash thoroughly, paying special  attention to the area where your surgery will be performed.  Thoroughly rinse your body with warm water from the neck down.  DO NOT shower/wash with your normal soap after using and rinsing off the CHG Soap.  Pat yourself dry with a CLEAN TOWEL.  Wear CLEAN PAJAMAS to bed the night before surgery  Place CLEAN SHEETS on your bed the night before your surgery  DO NOT SLEEP WITH PETS.   Day of Surgery:  Take a shower with CHG soap. Wear Clean/Comfortable clothing the morning of surgery Do not apply any deodorants/lotions.   Remember to brush your teeth WITH YOUR REGULAR TOOTHPASTE.   Please read over the following fact sheets that you were given.

## 2021-03-25 ENCOUNTER — Encounter: Payer: Self-pay | Admitting: Family Medicine

## 2021-03-25 ENCOUNTER — Encounter (HOSPITAL_COMMUNITY): Payer: Self-pay

## 2021-03-25 ENCOUNTER — Other Ambulatory Visit: Payer: Self-pay

## 2021-03-25 ENCOUNTER — Encounter (HOSPITAL_COMMUNITY)
Admission: RE | Admit: 2021-03-25 | Discharge: 2021-03-25 | Disposition: A | Discharge status: Home / Self Care | Payer: No Typology Code available for payment source | Source: Ambulatory Visit | Attending: Neurological Surgery | Admitting: Neurological Surgery

## 2021-03-25 DIAGNOSIS — Z20822 Contact with and (suspected) exposure to covid-19: Secondary | ICD-10-CM | POA: Insufficient documentation

## 2021-03-25 DIAGNOSIS — Z01812 Encounter for preprocedural laboratory examination: Secondary | ICD-10-CM | POA: Insufficient documentation

## 2021-03-25 HISTORY — DX: Essential (primary) hypertension: I10

## 2021-03-25 LAB — CBC
HCT: 45.1 % (ref 36.0–46.0)
Hemoglobin: 16 g/dL — ABNORMAL HIGH (ref 12.0–15.0)
MCH: 31.2 pg (ref 26.0–34.0)
MCHC: 35.5 g/dL (ref 30.0–36.0)
MCV: 87.9 fL (ref 80.0–100.0)
Platelets: 341 10*3/uL (ref 150–400)
RBC: 5.13 MIL/uL — ABNORMAL HIGH (ref 3.87–5.11)
RDW: 11.8 % (ref 11.5–15.5)
WBC: 13 10*3/uL — ABNORMAL HIGH (ref 4.0–10.5)
nRBC: 0 % (ref 0.0–0.2)

## 2021-03-25 LAB — SURGICAL PCR SCREEN
MRSA, PCR: NEGATIVE
Staphylococcus aureus: NEGATIVE

## 2021-03-25 LAB — SARS CORONAVIRUS 2 (TAT 6-24 HRS): SARS Coronavirus 2: NEGATIVE

## 2021-03-25 NOTE — Progress Notes (Signed)
PCP - Garret Reddish MD Cardiologist - Dr. Agustin Cree  PPM/ICD - denies Device Orders -  Rep Notified -   Chest x-ray - 01/27/21 EKG -03/04/21  Stress Test - none ECHO - 03/04/21 Cardiac Cath -   Sleep Study - none CPAP -   Fasting Blood Sugar - n/a   Blood Thinner Instructions:n/a Aspirin Instructions:n/a  ERAS Protcol -no PRE-SURGERY Ensure or G2-   COVID TEST- 03/25/21   Anesthesia review: yes-cardiac   Patient denies shortness of breath, fever, cough and chest pain at PAT appointment   All instructions explained to the patient, with a verbal understanding of the material. Patient agrees to go over the instructions while at home for a better understanding. Patient also instructed to self quarantine after being tested for COVID-19. The opportunity to ask questions was provided.

## 2021-03-26 NOTE — Progress Notes (Signed)
Anesthesia Chart Review:  Recent cardiology evaluation for atypical chest pain.  Coronary CTA showed coronary calcium score of 0.38, normal coronary arteries with right dominant, minimal nonobstructive CAD.  Cardiac clearance per telephone encounter 03/13/2021, "Chart reviewed as part of pre-operative protocol coverage. Given past medical history and time since last visit, based on ACC/AHA guidelines, [name] would be at acceptable risk for the planned procedure without further cardiovascular testing."   CBC 03/25/2021 reviewed, very mildly elevated WBC at 13.0, otherwise unremarkable.  BMP 03/04/2021 reviewed, WNL.  EKG 03/04/2021: NSR.  Rate 89.  Cannot rule out anterior infarct, age undetermined.  Coronary CT 03/10/2021: IMPRESSION: 1. Coronary calcium score of 0.38.   2. Normal coronary origin with right dominance.   3. CAD-RADS 1. Minimal non-obstructive CAD (0-24%). Consider non-atherosclerotic causes of chest pain. Consider preventive therapy and risk factor modification.  CHEST - 2 VIEW 01/27/21: COMPARISON:  Jan 02, 2021.   FINDINGS: The heart size and mediastinal contours are within normal limits. Both lungs are clear. No visible pleural effusions or pneumothorax. No acute osseous abnormality. Thoracic spine degenerative change.   IMPRESSION: No active cardiopulmonary disease.   [name] [facility] Short Stay Center/Anesthesiology Phone [phone] 03/26/2021 10:35 AM

## 2021-03-26 NOTE — Anesthesia Preprocedure Evaluation (Addendum)
Anesthesia Evaluation  Patient identified by MRN, date of birth, ID band Patient awake    Reviewed: Allergy & Precautions, NPO status , Patient's Chart, lab work & pertinent test results  History of Anesthesia Complications Negative for: history of anesthetic complications  Airway Mallampati: II  TM Distance: >3 FB Neck ROM: Limited    Dental  (+) Dental Advisory Given   Pulmonary neg pulmonary ROS,    Pulmonary exam normal        Cardiovascular hypertension, Pt. on medications Normal cardiovascular exam     Neuro/Psych  Headaches, negative psych ROS   GI/Hepatic negative GI ROS, Neg liver ROS,   Endo/Other   Obesity    Renal/GU negative Renal ROS     Musculoskeletal  (+) Arthritis ,   Abdominal   Peds  Hematology negative hematology ROS (+)   Anesthesia Other Findings Covid test negative   Reproductive/Obstetrics                           Anesthesia Physical Anesthesia Plan  ASA: 2  Anesthesia Plan: General   Post-op Pain Management:    Induction: Intravenous  PONV Risk Score and Plan: 4 or greater and Treatment may vary due to age or medical condition, Ondansetron, Midazolam, Dexamethasone and Scopolamine patch - Pre-op  Airway Management Planned: Oral ETT and Video Laryngoscope Planned  Additional Equipment: None  Intra-op Plan:   Post-operative Plan: Extubation in OR  Informed Consent: I have reviewed the patients History and Physical, chart, labs and discussed the procedure including the risks, benefits and alternatives for the proposed anesthesia with the patient or authorized representative who has indicated his/her understanding and acceptance.     Dental advisory given  Plan Discussed with: CRNA and Anesthesiologist  Anesthesia Plan Comments:       Anesthesia Quick Evaluation

## 2021-03-26 NOTE — Telephone Encounter (Signed)
The patient is currently scheduled for surgery on 03/28/2021

## 2021-03-28 ENCOUNTER — Ambulatory Visit (HOSPITAL_COMMUNITY): Payer: No Typology Code available for payment source

## 2021-03-28 ENCOUNTER — Other Ambulatory Visit: Payer: Self-pay

## 2021-03-28 ENCOUNTER — Ambulatory Visit (HOSPITAL_COMMUNITY): Payer: No Typology Code available for payment source | Admitting: Physician Assistant

## 2021-03-28 ENCOUNTER — Ambulatory Visit (HOSPITAL_COMMUNITY): Payer: No Typology Code available for payment source | Admitting: Certified Registered Nurse Anesthetist

## 2021-03-28 ENCOUNTER — Observation Stay (HOSPITAL_COMMUNITY)
Admission: RE | Admit: 2021-03-28 | Discharge: 2021-03-29 | Disposition: A | Discharge status: Home / Self Care | Payer: No Typology Code available for payment source | Attending: Neurological Surgery | Admitting: Neurological Surgery

## 2021-03-28 ENCOUNTER — Encounter (HOSPITAL_COMMUNITY): Payer: Self-pay | Admitting: Neurological Surgery

## 2021-03-28 ENCOUNTER — Encounter (HOSPITAL_COMMUNITY)
Admission: RE | Disposition: A | Discharge status: Home / Self Care | Payer: Self-pay | Source: Home / Self Care | Attending: Neurological Surgery

## 2021-03-28 DIAGNOSIS — M50122 Cervical disc disorder at C5-C6 level with radiculopathy: Principal | ICD-10-CM | POA: Insufficient documentation

## 2021-03-28 DIAGNOSIS — M5412 Radiculopathy, cervical region: Secondary | ICD-10-CM | POA: Diagnosis present

## 2021-03-28 DIAGNOSIS — I10 Essential (primary) hypertension: Secondary | ICD-10-CM | POA: Diagnosis not present

## 2021-03-28 DIAGNOSIS — Z79899 Other long term (current) drug therapy: Secondary | ICD-10-CM | POA: Diagnosis not present

## 2021-03-28 DIAGNOSIS — Z419 Encounter for procedure for purposes other than remedying health state, unspecified: Secondary | ICD-10-CM

## 2021-03-28 HISTORY — PX: CERVICAL DISC ARTHROPLASTY: SHX587

## 2021-03-28 LAB — POCT PREGNANCY, URINE: Preg Test, Ur: NEGATIVE

## 2021-03-28 IMAGING — RF DG C-ARM 1-60 MIN
1 series · 2 of 2 positions shown · non-contrast
Comparison: Cervical spine MRI [DATE].

CLINICAL DATA: Surgery, elective [72] ([72]-CM). Additional
history provided by technologist: Cervical Five - Six, Cervical Six
- Seven Artifical Disc Replacement For CERVICAL RADICULOPATHY.
Provided fluoroscopy time 1 minutes, 8 seconds (15.22 mGy).

EXAM:
CERVICAL SPINE - 2-3 VIEW; DG C-ARM 1-60 MIN

[Series 1: run · 2 of 2 slices shown]
[im 1/2]
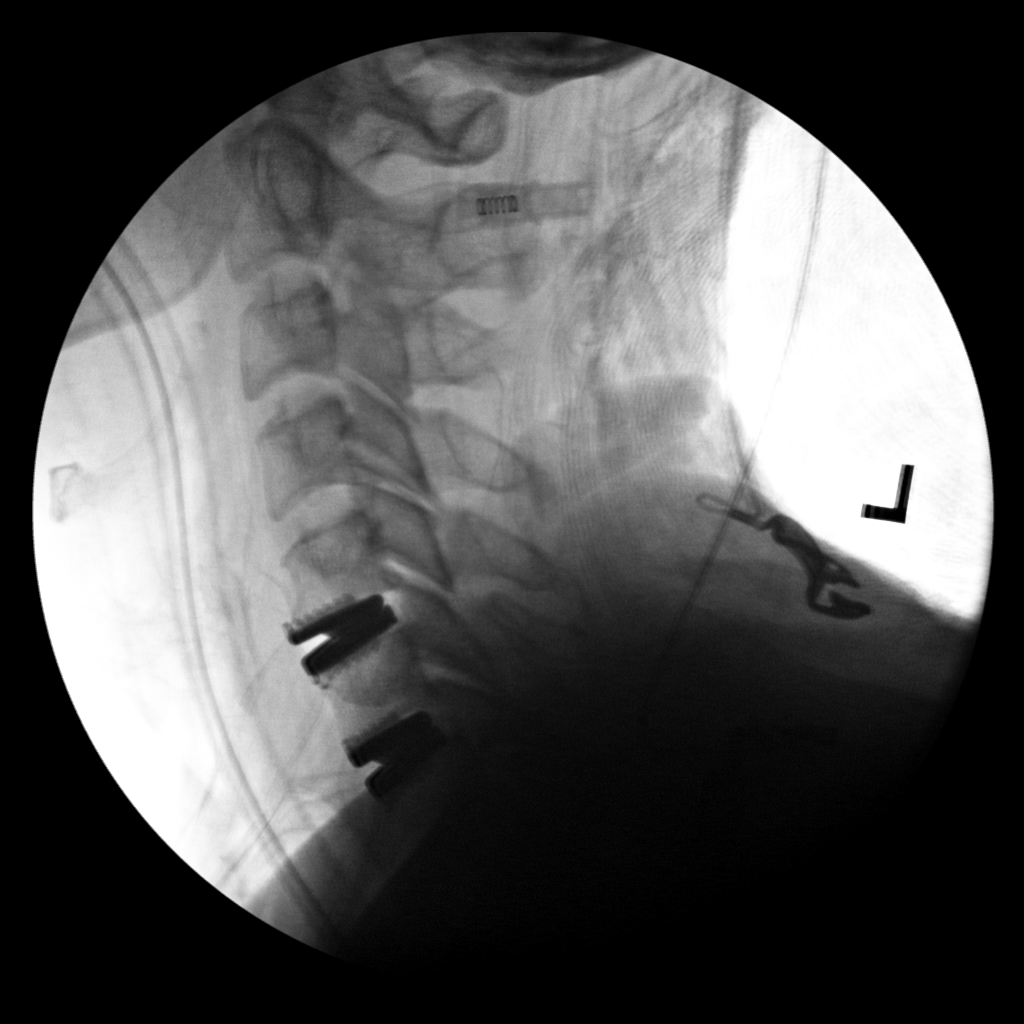
[im 2/2]
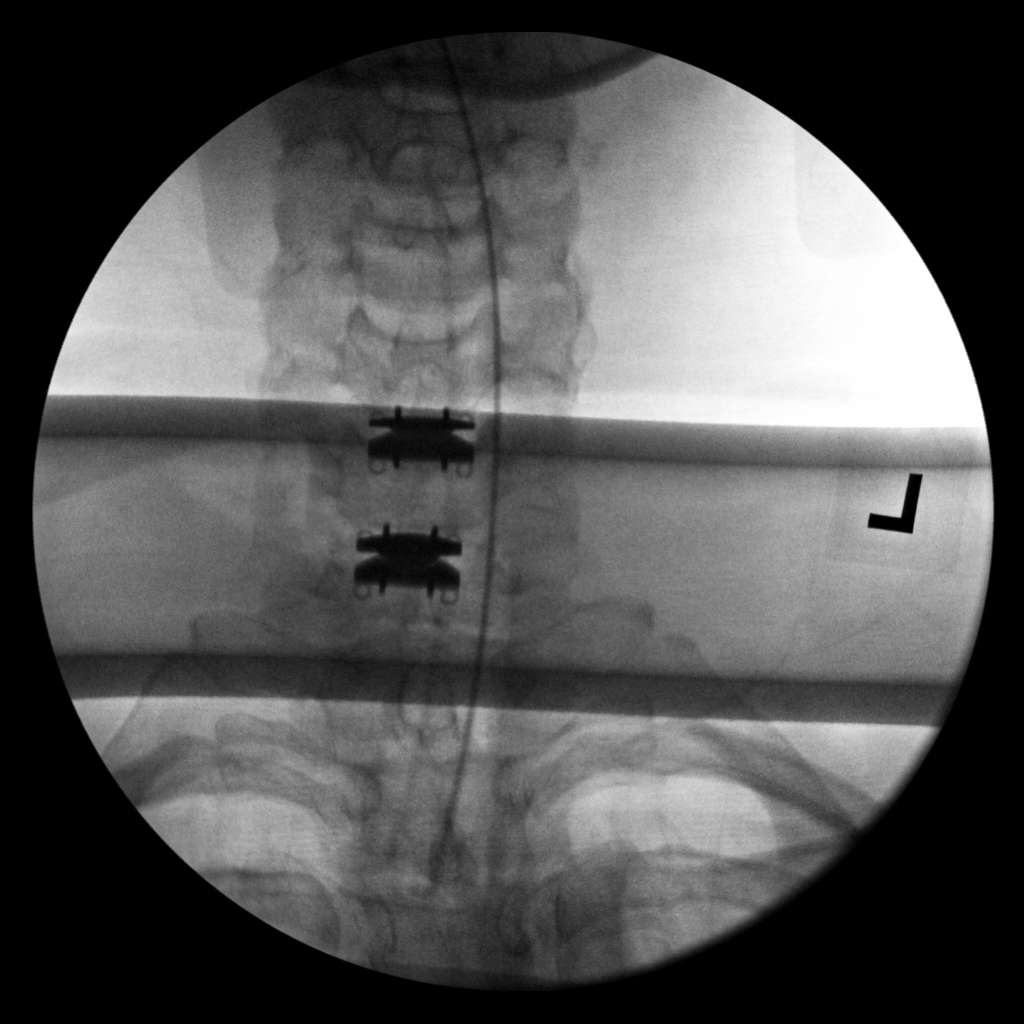

[2 of 2 positions shown; findings below may reference images not displayed]

FINDINGS: PA and lateral view intraoperative fluoroscopic images of the
cervical spine are submitted, 2 images total. On the provided
images, disc prostheses are now present at the C5-C6 and C6-C7
levels. An ET tube terminates at the T2-T3 level.
IMPRESSION: Two intraoperative fluoroscopic images of the cervical spine from
C5-C6 and C6-C7 disc arthroplasty.

## 2021-03-28 IMAGING — RF DG CERVICAL SPINE 2 OR 3 VIEWS
1 series · 2 of 2 positions shown · non-contrast
Comparison: Cervical spine MRI [DATE].

CLINICAL DATA: Surgery, elective [72] ([72]-CM). Additional
history provided by technologist: Cervical Five - Six, Cervical Six
- Seven Artifical Disc Replacement For CERVICAL RADICULOPATHY.
Provided fluoroscopy time 1 minutes, 8 seconds (15.22 mGy).

EXAM:
CERVICAL SPINE - 2-3 VIEW; DG C-ARM 1-60 MIN

[Series 1: run · 2 of 2 slices shown]
[im 1/2]
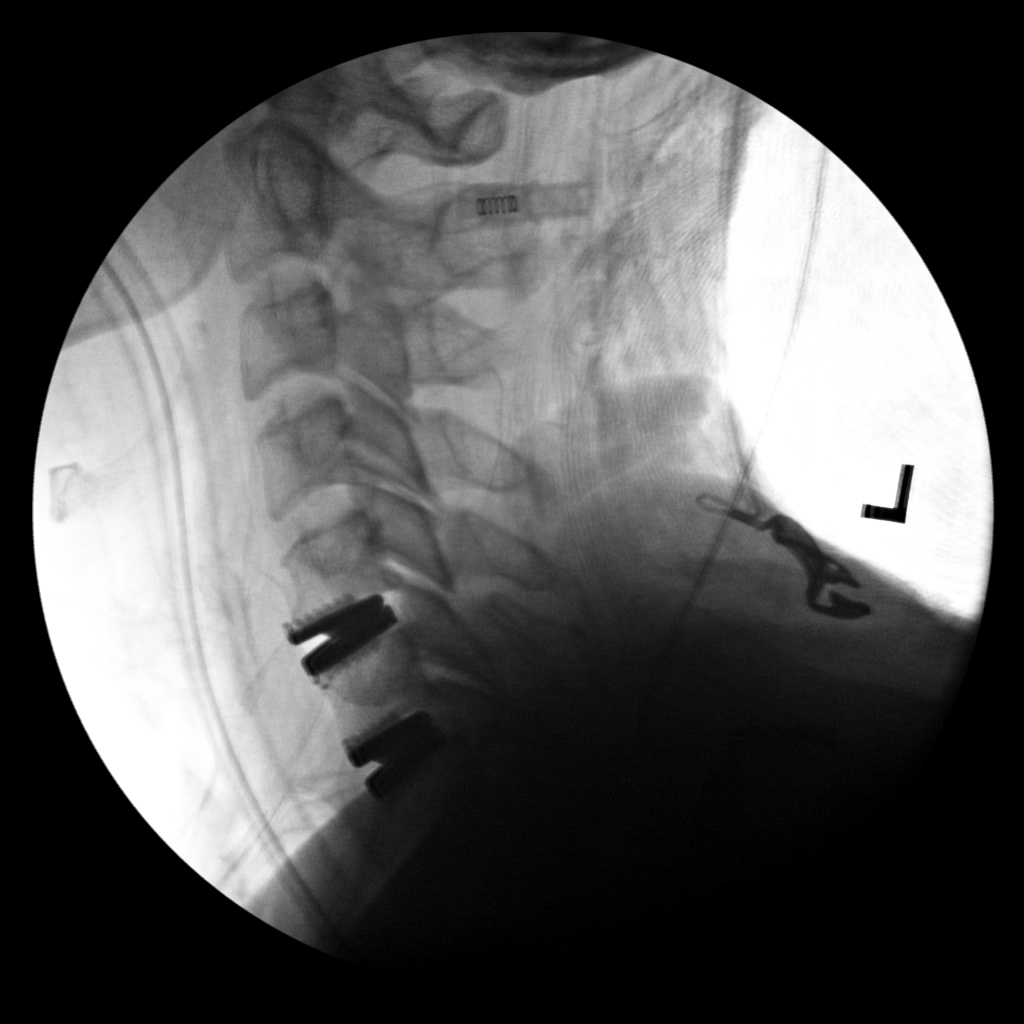
[im 2/2]
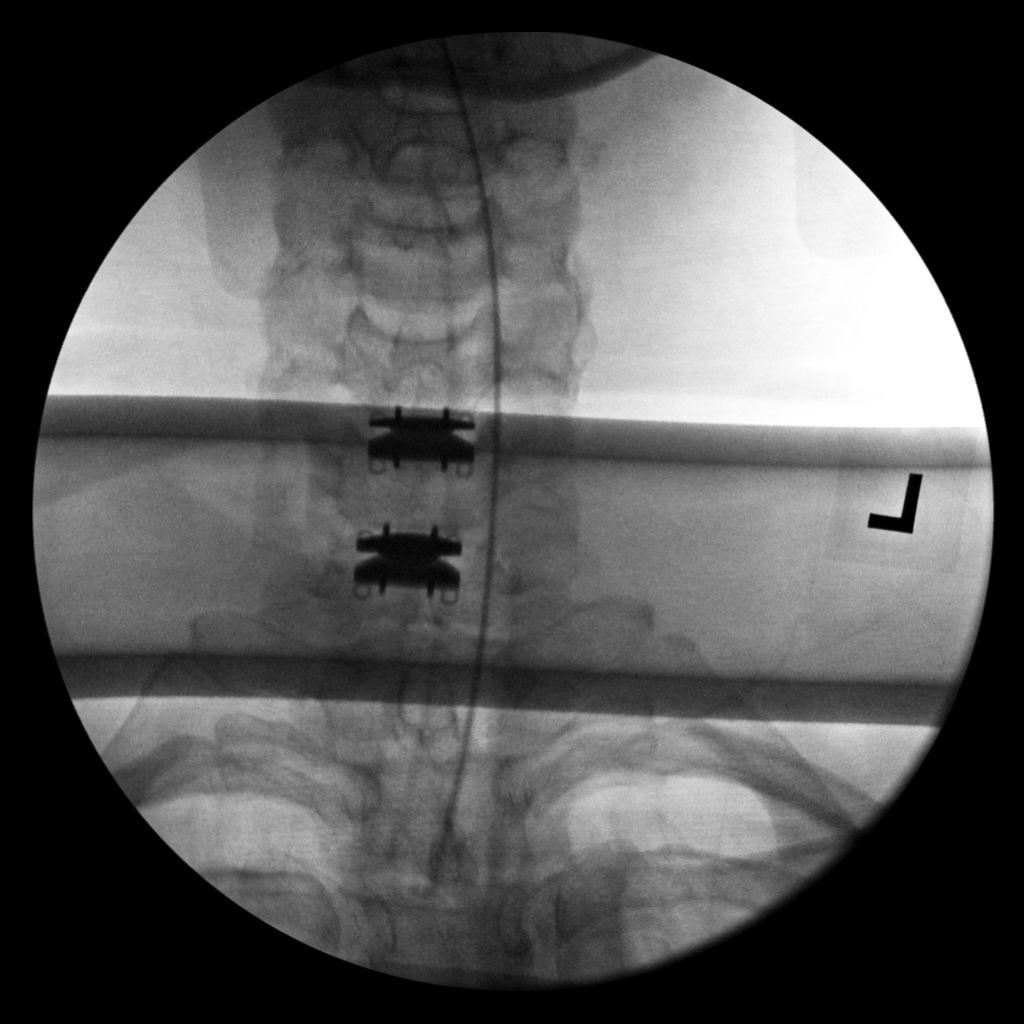

[2 of 2 positions shown; findings below may reference images not displayed]

FINDINGS: PA and lateral view intraoperative fluoroscopic images of the
cervical spine are submitted, 2 images total. On the provided
images, disc prostheses are now present at the C5-C6 and C6-C7
levels. An ET tube terminates at the T2-T3 level.
IMPRESSION: Two intraoperative fluoroscopic images of the cervical spine from
C5-C6 and C6-C7 disc arthroplasty.

## 2021-03-28 SURGERY — CERVICAL ANTERIOR DISC ARTHROPLASTY
Anesthesia: General

## 2021-03-28 MED ORDER — FENTANYL CITRATE (PF) 250 MCG/5ML IJ SOLN
INTRAMUSCULAR | Status: DC | PRN
  Administered 2021-03-28: 100 ug via INTRAVENOUS
  Administered 2021-03-28 (×5): 50 ug via INTRAVENOUS
  Administered 2021-03-28: 25 ug via INTRAVENOUS

## 2021-03-28 MED ORDER — ACETAMINOPHEN 650 MG RE SUPP: 650.0000 mg | RECTAL | Status: DC | PRN

## 2021-03-28 MED ORDER — ONDANSETRON HCL 4 MG PO TABS: 4.0000 mg | ORAL_TABLET | Freq: Four times a day (QID) | ORAL | Status: DC | PRN

## 2021-03-28 MED ORDER — SUGAMMADEX SODIUM 200 MG/2ML IV SOLN
INTRAVENOUS | Status: DC | PRN
  Administered 2021-03-28: 200 mg via INTRAVENOUS

## 2021-03-28 MED ORDER — OXYCODONE HCL 5 MG PO TABS
ORAL_TABLET | ORAL | Status: AC
  Filled 2021-03-28: qty 1

## 2021-03-28 MED ORDER — DEXAMETHASONE SODIUM PHOSPHATE 4 MG/ML IJ SOLN
2.0000 mg | Freq: Three times a day (TID) | INTRAMUSCULAR | Status: AC
  Administered 2021-03-28 – 2021-03-29 (×3): 2 mg via INTRAVENOUS
  Filled 2021-03-28 (×3): qty 1

## 2021-03-28 MED ORDER — THROMBIN 5000 UNITS EX SOLR
OROMUCOSAL | Status: DC | PRN
  Administered 2021-03-28: 5 mL via TOPICAL

## 2021-03-28 MED ORDER — SODIUM CHLORIDE 0.9 % IV SOLN: INTRAVENOUS | Status: DC

## 2021-03-28 MED ORDER — CHLORHEXIDINE GLUCONATE CLOTH 2 % EX PADS: 6.0000 | MEDICATED_PAD | Freq: Once | CUTANEOUS | Status: DC

## 2021-03-28 MED ORDER — ROCURONIUM BROMIDE 10 MG/ML (PF) SYRINGE
PREFILLED_SYRINGE | INTRAVENOUS | Status: DC | PRN
Start: 2021-03-28 — End: 2021-03-28
  Administered 2021-03-28: 60 mg via INTRAVENOUS

## 2021-03-28 MED ORDER — PHENYLEPHRINE HCL-NACL 20-0.9 MG/250ML-% IV SOLN
INTRAVENOUS | Status: DC | PRN
  Administered 2021-03-28: 35 ug/min via INTRAVENOUS

## 2021-03-28 MED ORDER — METHOCARBAMOL 1000 MG/10ML IJ SOLN
500.0000 mg | Freq: Four times a day (QID) | INTRAVENOUS | Status: DC | PRN
  Filled 2021-03-28: qty 5

## 2021-03-28 MED ORDER — LIDOCAINE-EPINEPHRINE 1 %-1:100000 IJ SOLN
INTRAMUSCULAR | Status: AC
  Filled 2021-03-28: qty 1

## 2021-03-28 MED ORDER — FENTANYL CITRATE (PF) 250 MCG/5ML IJ SOLN
INTRAMUSCULAR | Status: AC
  Filled 2021-03-28: qty 5

## 2021-03-28 MED ORDER — KETOROLAC TROMETHAMINE 15 MG/ML IJ SOLN
15.0000 mg | Freq: Four times a day (QID) | INTRAMUSCULAR | Status: AC
Start: 2021-03-28 — End: 2021-03-29
  Administered 2021-03-28 – 2021-03-29 (×4): 15 mg via INTRAVENOUS
  Filled 2021-03-28 (×4): qty 1

## 2021-03-28 MED ORDER — SENNOSIDES-DOCUSATE SODIUM 8.6-50 MG PO TABS: 1.0000 | ORAL_TABLET | Freq: Every evening | ORAL | Status: DC | PRN

## 2021-03-28 MED ORDER — AMLODIPINE BESYLATE 2.5 MG PO TABS
2.5000 mg | ORAL_TABLET | Freq: Every day | ORAL | Status: DC
  Administered 2021-03-29: 2.5 mg via ORAL
  Filled 2021-03-28: qty 1

## 2021-03-28 MED ORDER — ACETAMINOPHEN 10 MG/ML IV SOLN
INTRAVENOUS | Status: DC | PRN
  Administered 2021-03-28: 1000 mg via INTRAVENOUS

## 2021-03-28 MED ORDER — LIDOCAINE 2% (20 MG/ML) 5 ML SYRINGE
INTRAMUSCULAR | Status: DC | PRN
  Administered 2021-03-28: 100 mg via INTRAVENOUS

## 2021-03-28 MED ORDER — PHENYLEPHRINE 40 MCG/ML (10ML) SYRINGE FOR IV PUSH (FOR BLOOD PRESSURE SUPPORT)
PREFILLED_SYRINGE | INTRAVENOUS | Status: AC
  Filled 2021-03-28: qty 10

## 2021-03-28 MED ORDER — FENTANYL CITRATE (PF) 100 MCG/2ML IJ SOLN
25.0000 ug | INTRAMUSCULAR | Status: DC | PRN
  Administered 2021-03-28 (×2): 50 ug via INTRAVENOUS

## 2021-03-28 MED ORDER — SODIUM CHLORIDE 0.9% FLUSH
3.0000 mL | Freq: Two times a day (BID) | INTRAVENOUS | Status: DC
  Administered 2021-03-28 (×2): 3 mL via INTRAVENOUS

## 2021-03-28 MED ORDER — BUPIVACAINE HCL (PF) 0.5 % IJ SOLN
INTRAMUSCULAR | Status: AC
  Filled 2021-03-28: qty 30

## 2021-03-28 MED ORDER — DEXAMETHASONE 2 MG PO TABS
2.0000 mg | ORAL_TABLET | Freq: Three times a day (TID) | ORAL | Status: AC
  Filled 2021-03-28 (×3): qty 1

## 2021-03-28 MED ORDER — MORPHINE SULFATE (PF) 2 MG/ML IV SOLN
2.0000 mg | INTRAVENOUS | Status: DC | PRN
Start: 2021-03-28 — End: 2021-03-29
  Administered 2021-03-28: 2 mg via INTRAVENOUS
  Filled 2021-03-28: qty 1

## 2021-03-28 MED ORDER — ACETAMINOPHEN 325 MG PO TABS
650.0000 mg | ORAL_TABLET | ORAL | Status: DC | PRN
  Administered 2021-03-28 – 2021-03-29 (×2): 650 mg via ORAL
  Filled 2021-03-28 (×2): qty 2

## 2021-03-28 MED ORDER — PROPOFOL 10 MG/ML IV BOLUS
INTRAVENOUS | Status: AC
  Filled 2021-03-28: qty 20

## 2021-03-28 MED ORDER — CHLORHEXIDINE GLUCONATE 0.12 % MT SOLN
15.0000 mL | Freq: Once | OROMUCOSAL | Status: AC
  Administered 2021-03-28: 15 mL via OROMUCOSAL
  Filled 2021-03-28: qty 15

## 2021-03-28 MED ORDER — ORAL CARE MOUTH RINSE: 15.0000 mL | Freq: Once | OROMUCOSAL | Status: AC

## 2021-03-28 MED ORDER — PROMETHAZINE HCL 25 MG/ML IJ SOLN: 6.2500 mg | INTRAMUSCULAR | Status: DC | PRN

## 2021-03-28 MED ORDER — OXYCODONE HCL 5 MG PO TABS
10.0000 mg | ORAL_TABLET | ORAL | Status: DC | PRN
  Administered 2021-03-28 – 2021-03-29 (×4): 10 mg via ORAL
  Filled 2021-03-28 (×4): qty 2

## 2021-03-28 MED ORDER — ONDANSETRON HCL 4 MG/2ML IJ SOLN
4.0000 mg | Freq: Four times a day (QID) | INTRAMUSCULAR | Status: DC | PRN
Start: 2021-03-28 — End: 2021-03-29
  Administered 2021-03-28: 4 mg via INTRAVENOUS
  Filled 2021-03-28: qty 2

## 2021-03-28 MED ORDER — OXYCODONE HCL 5 MG/5ML PO SOLN
5.0000 mg | Freq: Once | ORAL | Status: AC | PRN
Start: 2021-03-28 — End: 2021-03-28

## 2021-03-28 MED ORDER — SODIUM CHLORIDE 0.9 % IV SOLN: 250.0000 mL | INTRAVENOUS | Status: DC

## 2021-03-28 MED ORDER — LACTATED RINGERS IV SOLN: INTRAVENOUS | Status: DC

## 2021-03-28 MED ORDER — LACTATED RINGERS IV SOLN: INTRAVENOUS | Status: DC | PRN

## 2021-03-28 MED ORDER — PROPOFOL 10 MG/ML IV BOLUS
INTRAVENOUS | Status: DC | PRN
  Administered 2021-03-28: 160 mg via INTRAVENOUS

## 2021-03-28 MED ORDER — FENTANYL CITRATE (PF) 100 MCG/2ML IJ SOLN
INTRAMUSCULAR | Status: AC
  Filled 2021-03-28: qty 2

## 2021-03-28 MED ORDER — MIDAZOLAM HCL 2 MG/2ML IJ SOLN
INTRAMUSCULAR | Status: AC
  Filled 2021-03-28: qty 2

## 2021-03-28 MED ORDER — 0.9 % SODIUM CHLORIDE (POUR BTL) OPTIME
TOPICAL | Status: DC | PRN
  Administered 2021-03-28: 1000 mL

## 2021-03-28 MED ORDER — CEFAZOLIN SODIUM-DEXTROSE 2-4 GM/100ML-% IV SOLN
2.0000 g | INTRAVENOUS | Status: AC
  Administered 2021-03-28: 2 g via INTRAVENOUS
  Filled 2021-03-28: qty 100

## 2021-03-28 MED ORDER — DEXAMETHASONE SODIUM PHOSPHATE 10 MG/ML IJ SOLN
INTRAMUSCULAR | Status: DC | PRN
Start: 2021-03-28 — End: 2021-03-28
  Administered 2021-03-28: 10 mg via INTRAVENOUS

## 2021-03-28 MED ORDER — SODIUM CHLORIDE 0.9% FLUSH: 3.0000 mL | INTRAVENOUS | Status: DC | PRN

## 2021-03-28 MED ORDER — HYDROCHLOROTHIAZIDE 12.5 MG PO CAPS
12.5000 mg | ORAL_CAPSULE | Freq: Every day | ORAL | Status: DC
  Administered 2021-03-28: 12.5 mg via ORAL
  Filled 2021-03-28 (×2): qty 1

## 2021-03-28 MED ORDER — THROMBIN 5000 UNITS EX SOLR
CUTANEOUS | Status: AC
  Filled 2021-03-28: qty 5000

## 2021-03-28 MED ORDER — METHOCARBAMOL 500 MG PO TABS
500.0000 mg | ORAL_TABLET | Freq: Four times a day (QID) | ORAL | Status: DC | PRN
  Administered 2021-03-28 – 2021-03-29 (×3): 500 mg via ORAL
  Filled 2021-03-28 (×3): qty 1

## 2021-03-28 MED ORDER — DROSPIRENONE 4 MG PO TABS: 4.0000 mg | ORAL_TABLET | Freq: Every day | ORAL | Status: DC

## 2021-03-28 MED ORDER — MENTHOL 3 MG MT LOZG
1.0000 | LOZENGE | OROMUCOSAL | Status: DC | PRN
  Filled 2021-03-28: qty 9

## 2021-03-28 MED ORDER — CEFAZOLIN SODIUM-DEXTROSE 2-4 GM/100ML-% IV SOLN
2.0000 g | Freq: Three times a day (TID) | INTRAVENOUS | Status: AC
  Administered 2021-03-28 (×2): 2 g via INTRAVENOUS
  Filled 2021-03-28 (×2): qty 100

## 2021-03-28 MED ORDER — DEXAMETHASONE SODIUM PHOSPHATE 10 MG/ML IJ SOLN
INTRAMUSCULAR | Status: AC
  Filled 2021-03-28: qty 1

## 2021-03-28 MED ORDER — ACETAMINOPHEN 10 MG/ML IV SOLN
INTRAVENOUS | Status: AC
  Filled 2021-03-28: qty 100

## 2021-03-28 MED ORDER — ONDANSETRON HCL 4 MG/2ML IJ SOLN
INTRAMUSCULAR | Status: AC
  Filled 2021-03-28: qty 2

## 2021-03-28 MED ORDER — PHENOL 1.4 % MT LIQD: 1.0000 | OROMUCOSAL | Status: DC | PRN

## 2021-03-28 MED ORDER — MIDAZOLAM HCL 2 MG/2ML IJ SOLN
INTRAMUSCULAR | Status: DC | PRN
  Administered 2021-03-28: 2 mg via INTRAVENOUS

## 2021-03-28 MED ORDER — OXYCODONE HCL 5 MG PO TABS
5.0000 mg | ORAL_TABLET | Freq: Once | ORAL | Status: AC | PRN
Start: 2021-03-28 — End: 2021-03-28
  Administered 2021-03-28: 5 mg via ORAL

## 2021-03-28 MED ORDER — SUCCINYLCHOLINE CHLORIDE 200 MG/10ML IV SOSY
PREFILLED_SYRINGE | INTRAVENOUS | Status: DC | PRN
  Administered 2021-03-28: 120 mg via INTRAVENOUS

## 2021-03-28 MED ORDER — ONDANSETRON HCL 4 MG/2ML IJ SOLN
INTRAMUSCULAR | Status: DC | PRN
  Administered 2021-03-28: 4 mg via INTRAVENOUS

## 2021-03-28 SURGICAL SUPPLY — 69 items
ADH SKN CLS APL DERMABOND .7 (GAUZE/BANDAGES/DRESSINGS) ×1
APL SKNCLS STERI-STRIP NONHPOA (GAUZE/BANDAGES/DRESSINGS) ×1
BAG COUNTER SPONGE SURGICOUNT (BAG) ×2 IMPLANT
BAG SPNG CNTER NS LX DISP (BAG) ×1
BAND INSRT 18 STRL LF DISP RB (MISCELLANEOUS)
BAND RUBBER #18 3X1/16 STRL (MISCELLANEOUS) IMPLANT
BASKET BONE COLLECTION (BASKET) IMPLANT
BENZOIN TINCTURE PRP APPL 2/3 (GAUZE/BANDAGES/DRESSINGS) ×1 IMPLANT
BIT DRILL FLUTELESS (BIT) ×1 IMPLANT
BIT DRILL NEURO 2X3.1 SFT TUCH (MISCELLANEOUS) ×1 IMPLANT
BLADE ULTRA TIP 2M (BLADE) IMPLANT
BUR BARREL STRAIGHT FLUTE 4.0 (BURR) IMPLANT
BUR CARBIDE MATCH 3.0 (BURR) ×1 IMPLANT
CANISTER SUCT 3000ML PPV (MISCELLANEOUS) ×2 IMPLANT
CARTRIDGE OIL MAESTRO DRILL (MISCELLANEOUS) ×1 IMPLANT
CLSR STERI-STRIP ANTIMIC 1/2X4 (GAUZE/BANDAGES/DRESSINGS) ×1 IMPLANT
DECANTER SPIKE VIAL GLASS SM (MISCELLANEOUS) ×2 IMPLANT
DERMABOND ADVANCED (GAUZE/BANDAGES/DRESSINGS) ×1
DERMABOND ADVANCED .7 DNX12 (GAUZE/BANDAGES/DRESSINGS) ×1 IMPLANT
DIFFUSER DRILL AIR PNEUMATIC (MISCELLANEOUS) ×2 IMPLANT
DISC CERVICAL PRESTIGE 6X16 (Orthopedic Implant) ×1 IMPLANT
DISC CERVICAL PRESTIGE 7X14MM (Spacer) ×1 IMPLANT
DRAPE C-ARM 42X72 X-RAY (DRAPES) ×3 IMPLANT
DRAPE HALF SHEET 40X57 (DRAPES) IMPLANT
DRAPE LAPAROTOMY 100X72 PEDS (DRAPES) ×2 IMPLANT
DRAPE MICROSCOPE LEICA (MISCELLANEOUS) ×2 IMPLANT
DRILL NEURO 2X3.1 SOFT TOUCH (MISCELLANEOUS) ×2
DRSG OPSITE POSTOP 4X6 (GAUZE/BANDAGES/DRESSINGS) ×1 IMPLANT
DRSG PAD ABDOMINAL 8X10 ST (GAUZE/BANDAGES/DRESSINGS) IMPLANT
DURAPREP 6ML APPLICATOR 50/CS (WOUND CARE) ×2 IMPLANT
ELECT BLADE INSULATED 4IN (ELECTROSURGICAL) ×2
ELECT COATED BLADE 2.86 ST (ELECTRODE) ×2 IMPLANT
ELECT REM PT RETURN 9FT ADLT (ELECTROSURGICAL) ×2
ELECTRODE BLADE INSULATED 4IN (ELECTROSURGICAL) IMPLANT
ELECTRODE REM PT RTRN 9FT ADLT (ELECTROSURGICAL) ×1 IMPLANT
GAUZE 4X4 16PLY ~~LOC~~+RFID DBL (SPONGE) IMPLANT
GAUZE SPONGE 4X4 12PLY STRL (GAUZE/BANDAGES/DRESSINGS) IMPLANT
GLOVE EXAM NITRILE XL STR (GLOVE) IMPLANT
GLOVE SRG 8 PF TXTR STRL LF DI (GLOVE) ×1 IMPLANT
GLOVE SURG ENC MOIS LTX SZ8 (GLOVE) ×2 IMPLANT
GLOVE SURG LTX SZ8 (GLOVE) ×2 IMPLANT
GLOVE SURG POLYISO LF SZ6.5 (GLOVE) ×1 IMPLANT
GLOVE SURG UNDER POLY LF SZ6.5 (GLOVE) ×1 IMPLANT
GLOVE SURG UNDER POLY LF SZ8 (GLOVE) ×2
GLOVE SURG UNDER POLY LF SZ8.5 (GLOVE) ×2 IMPLANT
GOWN STRL REUS W/ TWL LRG LVL3 (GOWN DISPOSABLE) IMPLANT
GOWN STRL REUS W/ TWL XL LVL3 (GOWN DISPOSABLE) ×1 IMPLANT
GOWN STRL REUS W/TWL 2XL LVL3 (GOWN DISPOSABLE) ×2 IMPLANT
GOWN STRL REUS W/TWL LRG LVL3 (GOWN DISPOSABLE)
GOWN STRL REUS W/TWL XL LVL3 (GOWN DISPOSABLE) ×2
HEMOSTAT POWDER KIT SURGIFOAM (HEMOSTASIS) ×2 IMPLANT
KIT BASIN OR (CUSTOM PROCEDURE TRAY) ×2 IMPLANT
KIT TURNOVER KIT B (KITS) ×2 IMPLANT
NDL HYPO 25X1 1.5 SAFETY (NEEDLE) ×1 IMPLANT
NDL SPNL 22GX3.5 QUINCKE BK (NEEDLE) ×1 IMPLANT
NEEDLE HYPO 25X1 1.5 SAFETY (NEEDLE) ×2 IMPLANT
NEEDLE SPNL 22GX3.5 QUINCKE BK (NEEDLE) ×2 IMPLANT
NS IRRIG 1000ML POUR BTL (IV SOLUTION) ×2 IMPLANT
OIL CARTRIDGE MAESTRO DRILL (MISCELLANEOUS) ×2
PACK LAMINECTOMY NEURO (CUSTOM PROCEDURE TRAY) ×2 IMPLANT
PAD ARMBOARD 7.5X6 YLW CONV (MISCELLANEOUS) ×2 IMPLANT
PIN DISTRACTION 14MM (PIN) ×2 IMPLANT
SPONGE INTESTINAL PEANUT (DISPOSABLE) ×4 IMPLANT
SPONGE SURGIFOAM ABS GEL SZ50 (HEMOSTASIS) IMPLANT
SUT VIC AB 3-0 SH 8-18 (SUTURE) ×2 IMPLANT
SWABSTK COMLB BENZOIN TINCTURE (MISCELLANEOUS) ×1 IMPLANT
TOWEL GREEN STERILE (TOWEL DISPOSABLE) ×2 IMPLANT
TOWEL GREEN STERILE FF (TOWEL DISPOSABLE) ×2 IMPLANT
WATER STERILE IRR 1000ML POUR (IV SOLUTION) ×2 IMPLANT

## 2021-03-28 NOTE — H&P (Signed)
Providing Compassionate, Quality Care - Together  NEUROSURGERY HISTORY & PHYSICAL   [name] is an 40 y.o. female.   Chief Complaint: Left upper extremity numbness HPI: This is a 40 year old female with a history of left upper extremity numbness for greater than 1 year.  This is chronically gotten worsened over the past few months, she went to the emergency department for cardiac work-up due to progressive numbness into her chest as well.  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  She has chronic C6 and C7 radiculopathy.  She failed conservative measures.  We discussed C5-6, C6-7 arthroplasty versus ACDF.  I recommended arthroplasty given her minimal spondylosis.  She presents today with no changes in her symptomatology.  She received cardiac clearance.  Past Medical History:  Diagnosis Date   Chest pain    Chicken pox    Chronic headaches    Hypertension    Shoulder pain    UTI (urinary tract infection)     Past Surgical History:  Procedure Laterality Date   CESAREAN SECTION     2002,2007,2010   TUBAL LIGATION  2010    Family History  Problem Relation Age of Onset   Asthma Mother    Diabetes Mother    Healthy Father    Kidney Stones Brother    Kidney Stones Sister    Kidney Stones Brother    Other Maternal Grandmother        106 years old in 2021   Social History:  reports that she has never smoked. She has never used smokeless tobacco. She reports that she does not drink alcohol and does not use drugs.  Allergies: No Known Allergies  Medications Prior to Admission  Medication Sig Dispense Refill   amLODipine (NORVASC) 2.5 MG tablet Take 1 tablet (2.5 mg total) by mouth daily. 90 tablet 1   Drospirenone 4 MG TABS Take 4 mg by mouth daily.     hydrochlorothiazide (MICROZIDE) 12.5 MG capsule Take 1 capsule (12.5 mg total) by mouth daily. 90 capsule 3   ibuprofen (ADVIL) 200 MG tablet  Take 400-600 mg by mouth every 6 (six) hours as needed for moderate pain or headache.     metoprolol tartrate (LOPRESSOR) 100 MG tablet Take 2 hours prior to CT 1 tablet 0   Pediatric Multivit-Minerals-C (MULTIVITAMIN CHILDRENS GUMMIES PO) Take 2 each by mouth daily.     albuterol (VENTOLIN HFA) 108 (90 Base) MCG/ACT inhaler Inhale 2 puffs into the lungs every 6 (six) hours as needed for wheezing or shortness of breath. (Patient not taking: No sig reported) 1 each 0    Results for orders placed or performed during the hospital encounter of 03/28/21 (from the past 48 hour(s))  Pregnancy, urine POC     Status: None   Collection Time: 03/28/21  7:04 AM  Result Value Ref Range   Preg Test, Ur NEGATIVE NEGATIVE    Comment:        THE SENSITIVITY OF THIS METHODOLOGY IS >24 mIU/mL    No results found.  ROS All pertinent positives and negatives are listed in HPI above  Blood pressure (!) 134/93, pulse 99, temperature 98.5 F (36.9 C), temperature source Oral, resp. rate 18, height '5\' 8"'$  (1.727 m), weight 98.4 kg, SpO2 98 %. Physical Exam  A&O x3 PERRLA EOMI Right upper extremity 5/5 Left upper extremity tricep, grip, wrist extension 4+/5, otherwise 5/5 decreased sensation left upper extremity  C6, C7 dermatomes Bilateral lower extremity 5/5 Cranial nerves II through XII  Assessment/Plan 40 year old female with  C6, C7 herniated nucleus pulposus with radiculopathy  -Or today for C5-6, C6-7 total disc arthroplasty.  Cardiac clearance obtained.  All risks benefits and expected outcomes discussed and agreed upon with the patient.  Thank you for allowing me to participate in this patient's care.  Please do not hesitate to call with questions or concerns.   Elwin Sleight, [facility] Cell: [phone]

## 2021-03-28 NOTE — Evaluation (Signed)
Occupational Therapy Evaluation and Discharge Patient Details Name: [name] MRN: [id number] DOB: 19-May-1981 Today's Date: 03/28/2021    History of Present Illness This 40 yo is s/p total disc arthroplasty C5-6, C6-7, [employer] (6 x 16 mm at C5-6, 6 x 14 mm at C6-7); anterior cervical discectomy, C5-6, C6-7 for decompression of neural elements and bilateral nerve roots, C6, C7   Clinical Impression   This 40 yo female admitted with above presents to acute OT with all education completed, we will D/C from acute OT.    Follow Up Recommendations  No OT follow up          Precautions / Restrictions Precautions Precautions: Cervical Precaution Booklet Issued: Yes (comment) Required Braces or Orthoses: Cervical Brace Cervical Brace: Soft collar Restrictions Weight Bearing Restrictions: No             ADL either performed or assessed with clinical judgement   ADL                                         General ADL Comments: Educated pt on all precautions she should follow when it comes to basic ADLs from handout provided as well as added using 2 cups for brushing teeth and always using cups with straws for drinking. Gave pt some extra sleeves from for soft collar and talked about usng a washcloth at chin part when eating/brushing teeth to protect/keep collar clean as possible. Pt reports she got up wtih RN eariler to go to bathroom without any issues.     Vision Patient Visual Report: No change from baseline              Pertinent Vitals/Pain Pain Assessment: Faces Faces Pain Scale: Hurts little more Pain Location: Bil shoulders Pain Descriptors / Indicators: Sore;Aching Pain Intervention(s): Limited activity within patient's tolerance     Hand Dominance Right   Extremity/Trunk Assessment Upper Extremity Assessment Upper Extremity Assessment: Overall WFL for tasks assessed           Communication Communication Communication:  No difficulties   Cognition Arousal/Alertness: Awake/alert Behavior During Therapy: WFL for tasks assessed/performed Overall Cognitive Status: Within Functional Limits for tasks assessed                                                Home Living Family/patient expects to be discharged to:: Private residence Living Arrangements: Spouse/significant other Available Help at Discharge: Family;Available PRN/intermittently Type of Home: House Home Access: Stairs to enter     Home Layout: Two level;Bed/bath upstairs         Bathroom Toilet: Standard     Home Equipment: None          Prior Functioning/Environment Level of Independence: Independent                 OT Problem List: Pain            OT Goals(Current goals can be found in the care plan section) Acute Rehab OT Goals Patient Stated Goal: for shoudlers to stop hurting and to go home tomorrow        AM-PAC OT "6 Clicks" Daily Activity     Outcome Measure Help from another person eating meals?: None Help from another person  taking care of personal grooming?: None Help from another person toileting, which includes using toliet, bedpan, or urinal?: None Help from another person bathing (including washing, rinsing, drying)?: None Help from another person to put on and taking off regular upper body clothing?: None Help from another person to put on and taking off regular lower body clothing?: None 6 Click Score: 24   End of Session    Activity Tolerance: Patient tolerated treatment well Patient left: in bed;with call bell/phone within reach;with family/visitor present  OT Visit Diagnosis: Pain Pain - part of body:  (shoulders)                Time: [id number] OT Time Calculation (min): 16 min Charges:  OT General Charges $OT Visit: 1 Visit OT Evaluation $OT Eval Low Complexity: 1 Low  [address], OTR/L Acute [employer] Pager [phone] Office [phone]    Almon Register 03/28/2021, 3:28 PM

## 2021-03-28 NOTE — Op Note (Signed)
Providing Compassionate, Quality Care - Together  Date of service: 03/28/2021  PREOP DIAGNOSIS:  Cervical radiculopathy, C6, C7, left Herniated nucleus pulposus, C5-6, C6-7, left  POSTOP DIAGNOSIS: Same  PROCEDURE: Total disc arthroplasty C5-6, C6-7, Medtronic Prestige LP (6 x 16 mm at C5-6, 6 x 14 mm at C6-7) Anterior cervical discectomy, C5-6, C6-7 for decompression of neural elements and bilateral nerve roots, C6, C7 Intraoperative use of fluoroscopy, greater than 1 hour Intraoperative use of microscope, for microdissection  SURGEON: Dr. Pieter Partridge C. Bethani Brugger, DO  ASSISTANT: Dr. Granville Lewis, MD  ANESTHESIA: General Endotracheal  EBL: 50 cc  SPECIMENS: None  DRAINS: None  COMPLICATIONS: None  CONDITION: Hemodynamically stable  HISTORY: [name] is a 40 y.o. female that presented my office with left upper extremity C6 and C7 radiculopathy for greater than 1 year.  She had progression over the past 3 to 4 months with significant numbness, tingling and weakness in her left upper extremity.  She had tried pain control without any significant improvement.  She had progression of her disease and ultimately went to the emergency department due to the numbness and tingling and was found to have severe neuroforaminal stenosis at C5-6 on the left with a large paracentral disc herniation and foraminal disc herniation.  There is also moderate to severe stenosis on the left at C6-7 with broad-based disc bulging and disc osteophyte complex.  We discussed surgical intervention in the form of TDA C5-7 given her limited spondylosis and her young age.  I also offered her an anterior cervical discectomy and fusion however we agreed that the arthroplasty would be better given preservation of range of motion.  All risks, benefits and expected outcomes were discussed and agreed upon with the patient and her husband.  PROCEDURE IN DETAIL: The patient was brought to the operating room. After induction  of general anesthesia, the patient was positioned on the operative table in the supine position. All pressure points were meticulously padded. Skin incision was then marked out and prepped and draped in the usual sterile fashion.  Physician driven timeout was performed.  Using a 10 blade, incision was made sharply down to the subcutaneous tissue.  Self-retaining retractors placed in the wound.  Platysma was opened longitudinally with electrocautery.  The platysma was undermined using Metzenbaum scissors.  The sternocleidomastoid and omohyoid were identified, using natural fascial planes the trachea and esophagus were retracted medially and the carotid laterally to access the prevertebral space.  Prevertebral fascia was opened sharply.  The prevertebral space was bluntly dissected with Kitners.  The longus coli were bilaterally identified.  A spinal needle was placed in the disc base at C5-6 and lateral fluoroscopy confirmed appropriate level of C5-6.  The longus coli were bilaterally elevated in a subperiosteal fashion using Bovie electrocautery at C5, C6 and C7.  Distraction pins were placed in midline at C6 and C7 vertebral bodies.  The self-retaining retractors were placed at the C6-7 interspace under the longus coli bilaterally.  Using a 15 blade, the C6-7 disc base was sharply incised.  The disc base was then placed on distraction.  Using a series of rongeurs and curettes, radical discectomy was performed.  Using a high-speed drill, remainder of the discectomy was performed, posterior osteophytes were removed with high-speed drill.  The cartilaginous endplates were removed with a high-speed drill.  Once the PLL was identified, using microcurette's this was gently elevated to access the epidural space.  The thecal sac was identified.  Using micro curettes  and Kerrison rongeurs, the PLL was resected bilaterally into the neuroforamina.  The thecal sac and bilateral nerve roots were decompressed.  Using micro  nerve hook, this was confirmed bilaterally.  The disc base was taken of distraction.  Epidural hemostasis was achieved with Surgiflo.  Of note there was a somewhat calcified disc herniation in the left lateral recess and left foramina at this level.  At this point, attention was turned to sizing for the arthroplasty.  Using lateral and AP fluoroscopy, appropriate size was found to be 6 x 14 mm.  Using a high-speed drill, anterior osteophytes were removed at C6 and C7.  Using the Medtronic device, the endplates were scored appropriately.  The 6 x 14 arthroplasty device was then placed and confirmed in the appropriate placement using AP and lateral fluoroscopy.  Distraction pin was removed at C7, Surgiflo was used for hemostasis.  Distraction pin was then placed at C5 at midline.  Self-retaining retractors were then placed under the longus coli bilaterally at C5-6. Using a 15 blade, the C5-6 disc base was sharply incised.  The disc base was then placed on distraction.  Using a series of rongeurs and curettes, radical discectomy was performed.  Using a high-speed drill, remainder of the discectomy was performed, posterior osteophytes were removed with high-speed drill.  The cartilaginous endplates were removed with a high-speed drill.  Once the PLL was identified, using microcurette's this was gently elevated to access the epidural space.  The thecal sac was identified.  Using micro curettes and Kerrison rongeurs, the PLL was resected bilaterally into the neuroforamina.  The thecal sac and bilateral nerve roots were decompressed.  Using micro nerve hook, this was confirmed bilaterally.  The disc base was taken off distraction.  Epidural hemostasis was achieved with Surgiflo.  Of note at this level, there was a large paracentral disc herniation on the left with superior and inferior migration.  This was removed with a series of micro curettes, kerrison ronguers and micro nerve hooks.   At this point, attention was  turned to sizing for the arthroplasty.  Using lateral and AP fluoroscopy, appropriate size was found to be 6 x 16 mm.  Using a high-speed drill, anterior osteophytes were removed at C5 and C6.  Using the Medtronic device applicator, the endplates were scored appropriately.  The 6 x 16 mm arthroplasty device was then placed and confirmed in the appropriate placement using AP and lateral fluoroscopy.  Distraction pin was removed at C5, C6, Surgiflo was used for hemostasis.  Self-retaining retractors were taken out of the wound.  The wound was copiously irrigated and noted to be excellently hemostatic.  The skin was closed with staples.  Sterile dressing applied.  At the end of the case all sponge, needle, and instrument counts were correct. The patient was then transferred to the stretcher, extubated, and taken to the post-anesthesia care unit in stable hemodynamic condition.

## 2021-03-28 NOTE — Transfer of Care (Signed)
Immediate Anesthesia Transfer of Care Note  Patient: [name]  Procedure(s) Performed: Artifical disc replacement, cervical five -six, cervical six-seven  Patient Location: PACU  Anesthesia Type:General  Level of Consciousness: awake and drowsy  Airway & Oxygen Therapy: Patient Spontanous Breathing and Patient connected to face mask oxygen  Post-op Assessment: Report given to RN and Post -op Vital signs reviewed and stable  Post vital signs: Reviewed and stable  Last Vitals:  Vitals Value Taken Time  BP 140/96 03/28/21 1110  Temp 36.3 C 03/28/21 1110  Pulse 89 03/28/21 1113  Resp 31 03/28/21 1113  SpO2 97 % 03/28/21 1113  Vitals shown include unvalidated device data.  Last Pain:  Vitals:   03/28/21 0616  TempSrc:   PainSc: 0-No pain         Complications: No notable events documented.

## 2021-03-28 NOTE — Progress Notes (Signed)
   Providing Compassionate, Quality Care - Together  NEUROSURGERY PROGRESS NOTE   S: pt s/e in pacu, complains of some BL shoulder pain, radiculopathy improved in LUE  O: EXAM:  BP 114/82   Pulse 86   Temp (!) 97.4 F (36.3 C)   Resp (!) 21   Ht '5\' 8"'$  (1.727 m)   Wt 98.4 kg   SpO2 92%   BMI 32.99 kg/m   Awake, alert, oriented  Speech fluent, appropriate  Neck soft Collar in place Trachea midline Nml phonation CNs grossly intact  5/5 BUE/BLE  SILT  ASSESSMENT:  40 y.o. female with   C6, C7 HNP with LUE radiculopathy  S/p C5-6, C6-7 TDA on 03/28/2021  PLAN: - pt/ot -soft collar for comfort -toradol -pain control    Thank you for allowing me to participate in this patient's care.  Please do not hesitate to call with questions or concerns.   Elwin Sleight, [facility] Cell: [phone]

## 2021-03-28 NOTE — Anesthesia Postprocedure Evaluation (Signed)
Anesthesia Post Note  Patient: [name]  Procedure(s) Performed: Artifical disc replacement, cervical five -six, cervical six-seven     Patient location during evaluation: PACU Anesthesia Type: General Level of consciousness: awake and alert Pain management: pain level controlled Vital Signs Assessment: post-procedure vital signs reviewed and stable Respiratory status: spontaneous breathing, nonlabored ventilation and respiratory function stable Cardiovascular status: stable and blood pressure returned to baseline Anesthetic complications: no   No notable events documented.  Last Vitals:  Vitals:   03/28/21 1240 03/28/21 1302  BP: 130/85 121/84  Pulse: 82 81  Resp:  20  Temp: (!) 36.1 C   SpO2: 95% 95%                  Audry Pili

## 2021-03-28 NOTE — Progress Notes (Signed)
Orthopedic Tech Progress Note Patient Details:  [name] 1980/10/05 [id number] Applied soft collar. Ortho Devices Type of Ortho Device: Soft collar Ortho Device/Splint Interventions: Application   Post Interventions Patient Tolerated: Well Instructions Provided: Adjustment of device  Petra Kuba 03/28/2021, 12:02 PM

## 2021-03-28 NOTE — Anesthesia Procedure Notes (Signed)
Procedure Name: Intubation Date/Time: 03/28/2021 7:50 AM Performed by: Inda Coke, CRNA Pre-anesthesia Checklist: Patient identified, Emergency Drugs available, Suction available and Patient being monitored Patient Re-evaluated:Patient Re-evaluated prior to induction Oxygen Delivery Method: Circle System Utilized Preoxygenation: Pre-oxygenation with 100% oxygen Induction Type: IV induction and Rapid sequence Ventilation: Mask ventilation without difficulty Laryngoscope Size: Glidescope and 3 Grade View: Grade I Tube type: Oral Tube size: 7.0 mm Number of attempts: 1 Airway Equipment and Method: Stylet and Oral airway Placement Confirmation: ETT inserted through vocal cords under direct vision, positive ETCO2 and breath sounds checked- equal and bilateral Secured at: 22 cm Tube secured with: Tape Dental Injury: Teeth and Oropharynx as per pre-operative assessment

## 2021-03-29 DIAGNOSIS — M50122 Cervical disc disorder at C5-C6 level with radiculopathy: Secondary | ICD-10-CM | POA: Diagnosis not present

## 2021-03-29 MED ORDER — HYDROCODONE-ACETAMINOPHEN 5-325 MG PO TABS: 1.0000 | ORAL_TABLET | Freq: Four times a day (QID) | ORAL | 0 refills | Status: DC | PRN

## 2021-03-29 MED ORDER — METHOCARBAMOL 500 MG PO TABS: 500.0000 mg | ORAL_TABLET | Freq: Four times a day (QID) | ORAL | 1 refills | Status: DC | PRN

## 2021-03-29 NOTE — Evaluation (Signed)
Physical Therapy Evaluation and DISCHARGE Patient Details Name: [name] MRN: [id number] DOB: 26-Jan-1981 Today's Date: 03/29/2021   History of Present Illness  This 40 yo is s/p total disc arthroplasty C5-6, C6-7, Medtronic Prestige LP (6 x 16 mm at C5-6, 6 x 14 mm at C6-7); anterior cervical discectomy, C5-6, C6-7 for decompression of neural elements and bilateral nerve roots, C6, C7   Clinical Impression  Pt functioning at supervision level. Pt mildly unsteady during ambulation however pt unable to look down due to soft collar contributing to impaired equilibrium compared to normal. Pt with good home set up and support. Pt complete ambulation and stair negotiation without difficulty and demos good understanding and carry over of cervical precautions. Discussed transfer into [place] and not pulling self up into car. Pt with no further acute PT needs at this time. PT SIGNING OFF. Please re-consult if needed in future.    Follow Up Recommendations No PT follow up    Equipment Recommendations  None recommended by PT    Recommendations for Other Services       Precautions / Restrictions Precautions Precautions: Cervical Precaution Booklet Issued: Yes (comment) Precaution Comments: pt with good recall Required Braces or Orthoses: Cervical Brace Cervical Brace: Soft collar Restrictions Weight Bearing Restrictions: No      Mobility  Bed Mobility Overal bed mobility: Modified Independent             General bed mobility comments: verbal cues for log roll, no physical assist needed    Transfers Overall transfer level: Modified independent Equipment used: None             General transfer comment: pt with good hand placement, minimal trunk/neck flexion  Ambulation/Gait Ambulation/Gait assistance: Supervision Gait Distance (Feet): 200 Feet Assistive device: None Gait Pattern/deviations: Step-through pattern;Decreased stride length Gait velocity: dec Gait  velocity interpretation: 1.31 - 2.62 ft/sec, indicative of limited community ambulator General Gait Details: mildly unsteady due to inability to look down due to soft collar, steadied self occasionally on hallway counter  Stairs Stairs: Yes Stairs assistance: Min guard Stair Management: One rail Left;Alternating pattern Number of Stairs: 12 General stair comments: also educated on step 2 pattern if L LE becomes painful again  Wheelchair Mobility    Modified Rankin (Stroke Patients Only)       Balance Overall balance assessment: No apparent balance deficits (not formally assessed)                                           Pertinent Vitals/Pain Faces Pain Scale: Hurts little more Pain Location: Bil shoulders Pain Descriptors / Indicators: Sore;Aching    Home Living Family/patient expects to be discharged to:: Private residence Living Arrangements: Spouse/significant other Available Help at Discharge: Family;Available PRN/intermittently Type of Home: House Home Access: Stairs to enter Entrance Stairs-Rails: None Entrance Stairs-Number of Steps: 2 Home Layout: Two level;Bed/bath upstairs Home Equipment: None      Prior Function Level of Independence: Independent               Hand Dominance   Dominant Hand: Right    Extremity/Trunk Assessment   Upper Extremity Assessment Upper Extremity Assessment: Overall WFL for tasks assessed    Lower Extremity Assessment Lower Extremity Assessment: Overall WFL for tasks assessed       Communication   Communication: No difficulties  Cognition Arousal/Alertness: Awake/alert Behavior During Therapy:  WFL for tasks assessed/performed Overall Cognitive Status: Within Functional Limits for tasks assessed                                        General Comments General comments (skin integrity, edema, etc.): VSS, incision covered by honeycomb dressing    Exercises      Assessment/Plan    PT Assessment Patent does not need any further PT services  PT Problem List         PT Treatment Interventions      PT Goals (Current goals can be found in the Care Plan section)  Acute Rehab PT Goals PT Goal Formulation: All assessment and education complete, DC therapy    Frequency     Barriers to discharge        Co-evaluation               AM-PAC PT "6 Clicks" Mobility  Outcome Measure Help needed turning from your back to your side while in a flat bed without using bedrails?: None Help needed moving from lying on your back to sitting on the side of a flat bed without using bedrails?: None Help needed moving to and from a bed to a chair (including a wheelchair)?: None Help needed standing up from a chair using your arms (e.g., wheelchair or bedside chair)?: None Help needed to walk in hospital room?: A Little Help needed climbing 3-5 steps with a railing? : A Little 6 Click Score: 22    End of Session Equipment Utilized During Treatment: Cervical collar Activity Tolerance: Patient tolerated treatment well Patient left: in bed;with call bell/phone within reach;with family/visitor present (sitting EOB) Nurse Communication: Mobility status PT Visit Diagnosis: Difficulty in walking, not elsewhere classified (R26.2)    Time: [id number] PT Time Calculation (min) (ACUTE ONLY): 14 min   Charges:   PT Evaluation $PT Eval Low Complexity: 1 Low          Kittie Plater, PT, DPT Acute Rehabilitation Services Pager #: [phone] Office #: [phone]   Berline Lopes 03/29/2021, 11:28 AM

## 2021-03-29 NOTE — Discharge Summary (Signed)
Physician Discharge Summary  Patient ID: [name] MRN: [id number] DOB/AGE: 19-Oct-1980 40 y.o.  Admit date: 03/28/2021 Discharge date: 03/29/2021  Admission Diagnoses:  Discharge Diagnoses:  Active Problems:   Cervical radiculopathy at C6   Discharged Condition: good  Hospital Course: Patient mated to hospital where she underwent uncomplicated anterior cervical discectomy and fusion.  Postop Truman Hayward doing very well.  Preoperative neck and upper extremity symptoms much improved.  Strength and sensation have returned to normal.  Swallowing well.  Walking without difficulty.  Ready for discharge home.  Consults:   Significant Diagnostic Studies:   Treatments:   Discharge Exam: Blood pressure 116/79, pulse 68, temperature 98.2 F (36.8 C), temperature source Oral, resp. rate 16, height '5\' 8"'$  (1.727 m), weight 98.4 kg, SpO2 96 %. Awake and alert.  Oriented and appropriate.  Motor and sensory function intact.  Wound clean and dry.  Chest and abdomen benign.  Disposition: Discharge disposition: 01-Home or Self Care       Discharge Instructions     Incentive spirometry RT   Complete by: As directed       Allergies as of 03/29/2021   No Known Allergies      Medication List     TAKE these medications    amLODipine 2.5 MG tablet Commonly known as: NORVASC Take 1 tablet (2.5 mg total) by mouth daily.   Drospirenone 4 MG Tabs Take 4 mg by mouth daily.   hydrochlorothiazide 12.5 MG capsule Commonly known as: MICROZIDE Take 1 capsule (12.5 mg total) by mouth daily.   HYDROcodone-acetaminophen 5-325 MG tablet Commonly known as: Norco Take 1 tablet by mouth every 6 (six) hours as needed for moderate pain.   ibuprofen 200 MG tablet Commonly known as: ADVIL Take 400-600 mg by mouth every 6 (six) hours as needed for moderate pain or headache.   methocarbamol 500 MG tablet Commonly known as: ROBAXIN Take 1 tablet (500 mg total) by mouth every 6 (six) hours as  needed for muscle spasms.   metoprolol tartrate 100 MG tablet Commonly known as: LOPRESSOR Take 2 hours prior to CT   MULTIVITAMIN CHILDRENS GUMMIES PO Take 2 each by mouth daily.       ASK your doctor about these medications    albuterol 108 (90 Base) MCG/ACT inhaler Commonly known as: VENTOLIN HFA Inhale 2 puffs into the lungs every 6 (six) hours as needed for wheezing or shortness of breath.         Signed: Cooper Render Davionte Lusby 03/29/2021, 10:16 AM

## 2021-03-29 NOTE — Progress Notes (Signed)
Patient discharge home per order. Patient alert and oriented with no c/o pain and no s/s of infection on incision sites, No fever noted. Discharge instructions given to patient with understanding of instructions given.

## 2021-03-29 NOTE — Discharge Instructions (Signed)

## 2021-04-01 ENCOUNTER — Encounter: Payer: Self-pay | Admitting: Family Medicine

## 2021-04-02 ENCOUNTER — Ambulatory Visit: Payer: Self-pay | Admitting: Neurology

## 2021-04-02 ENCOUNTER — Encounter (HOSPITAL_COMMUNITY): Payer: Self-pay | Admitting: Neurological Surgery

## 2021-04-09 ENCOUNTER — Encounter: Payer: Self-pay | Admitting: Neurology

## 2021-04-09 ENCOUNTER — Ambulatory Visit (INDEPENDENT_AMBULATORY_CARE_PROVIDER_SITE_OTHER): Payer: No Typology Code available for payment source | Admitting: Neurology

## 2021-04-09 VITALS — BP 135/91 | HR 71 | Ht 68.0 in | Wt 212.0 lb

## 2021-04-09 DIAGNOSIS — M4802 Spinal stenosis, cervical region: Secondary | ICD-10-CM | POA: Diagnosis not present

## 2021-04-09 DIAGNOSIS — G43009 Migraine without aura, not intractable, without status migrainosus: Secondary | ICD-10-CM | POA: Diagnosis not present

## 2021-04-09 DIAGNOSIS — Z9889 Other specified postprocedural states: Secondary | ICD-10-CM | POA: Diagnosis not present

## 2021-04-09 DIAGNOSIS — M5412 Radiculopathy, cervical region: Secondary | ICD-10-CM

## 2021-04-09 MED ORDER — AMITRIPTYLINE HCL 25 MG PO TABS: 25.0000 mg | ORAL_TABLET | Freq: Every day | ORAL | 0 refills | Status: DC

## 2021-04-09 MED ORDER — SUMATRIPTAN SUCCINATE 50 MG PO TABS: 50.0000 mg | ORAL_TABLET | ORAL | 3 refills | Status: DC | PRN

## 2021-04-09 NOTE — Progress Notes (Signed)
[facility]  PATIENT: [name] DOB: [birth year]  REFERRING CLINICIAN: Sherwood Gambler, MD HISTORY FROM: Patient  REASON FOR VISIT: Headaches/Neck pain   HISTORICAL  CHIEF COMPLAINT:  Chief Complaint  Patient presents with   New Patient (Initial Visit)    Rm 13,with friend, states she has improves, reports daily headaches, c/o today      HISTORY OF PRESENT ILLNESS:  This is a 40 year old woman with past medical history of hypertension, chronic headaches who is presenting for headaches, neck pain and left shoulder pain and numbness.  For her headaches patient reported she has been having headache for about 20 years, described the headaches are all over her head, pulsatile pain.  For headache she has tried ibuprofen with some minimal relief, reported that 1 her doctor previously had put her on a abortive medication possibly sumatriptan but she has never been on a preventive medication.  She does have sensitivity to light with her headaches but no nausea no vomiting no photophobia.  She does not describe any auras.  Patient reports in the month of May-June she has been having worsening headache, in the back of the head with worsening neck pain and left shoulder pain and numbness of the left arm going down to her fingertips.  She had MRI and MRA of the head and also MRI of the neck which showed a large dissecting left paracentral foraminal disc protrusion with severe stenosis of the left aspect of the canal. The cord is deformed and displaced to the right.  She is status post cervical disc fusion about 2 weeks ago.  Reported since her surgery her headaches are still there but they are mild not sure it is related to the surgery itself or related to the pain medication that she is taking for the surgery.  She is also here to discuss her Brain MRI results.     Headache History and Characteristics: Onset: 20 years but worse since May Location: Diffuse Quality:  Pulsating  Intensity: 7/10.  Migrainous Features: Photophobia, phonophobia, nausea, vomiting.  Aura: No  History of brain injury or tumor: No  Family history: 61 yo Daughter with headaches  Motion sickness: no Cardiac history: no  OTC: tylenol, codeine Caffeine: soda once a week  Sleep: Will get about 4 to 5 hours per night  Mood/ Stress: Stress is low, mood is fine   Prior prophylaxis: Propranolol: No  Verapamil:No TCA: No Topamax: No Depakote: No Effexor: No Cymbalta: No Neurontin:No  Prior abortives: Triptan: Possibly Anti-emetic: No Steroids: No Ergotamine suppository: No  Prior interventions: None   OTHER MEDICAL CONDITIONS: Chronic headaches, hypertension,    REVIEW OF SYSTEMS: Full 14 system review of systems performed and negative with exception of: as noted in the HPI  ALLERGIES: No Known Allergies  HOME MEDICATIONS: Outpatient Medications Prior to Visit  Medication Sig Dispense Refill   albuterol (VENTOLIN HFA) 108 (90 Base) MCG/ACT inhaler Inhale 2 puffs into the lungs every 6 (six) hours as needed for wheezing or shortness of breath. 1 each 0   amLODipine (NORVASC) 2.5 MG tablet Take 1 tablet (2.5 mg total) by mouth daily. 90 tablet 1   Drospirenone 4 MG TABS Take 4 mg by mouth daily.     HYDROcodone-acetaminophen (NORCO) 5-325 MG tablet Take 1 tablet by mouth every 6 (six) hours as needed for moderate pain. 30 tablet 0   ibuprofen (ADVIL) 200 MG tablet Take 400-600 mg by mouth every 6 (six) hours as needed for moderate pain  or headache.     methocarbamol (ROBAXIN) 500 MG tablet Take 1 tablet (500 mg total) by mouth every 6 (six) hours as needed for muscle spasms. 30 tablet 1   metoprolol tartrate (LOPRESSOR) 100 MG tablet Take 2 hours prior to CT 1 tablet 0   Pediatric Multivit-Minerals-C (MULTIVITAMIN CHILDRENS GUMMIES PO) Take 2 each by mouth daily.     hydrochlorothiazide (MICROZIDE) 12.5 MG capsule Take 1 capsule (12.5 mg total) by mouth daily. 90  capsule 3   No facility-administered medications prior to visit.    PAST MEDICAL HISTORY: Past Medical History:  Diagnosis Date   Chest pain    Chicken pox    Chronic headaches    Hypertension    Shoulder pain    UTI (urinary tract infection)     PAST SURGICAL HISTORY: Past Surgical History:  Procedure Laterality Date   CERVICAL DISC ARTHROPLASTY N/A 03/28/2021   Procedure: Artifical disc replacement, cervical five -six, cervical six-seven;  Surgeon: Karsten Ro, DO;  Location: [facility];  Service: Neurosurgery;  Laterality: N/A;  3C   CESAREAN SECTION     2002,2007,2010   TUBAL LIGATION  2010    FAMILY HISTORY: Family History  Problem Relation Age of Onset   Asthma Mother    Diabetes Mother    Healthy Father    Kidney Stones Brother    Kidney Stones Sister    Kidney Stones Brother    Other Maternal Grandmother        40 years old in 2021    SOCIAL HISTORY: Social History   Socioeconomic History   Marital status: Married    Spouse name: Not on file   Number of children: 3   Years of education: 12   Highest education level: Not on file  Occupational History   Not on file  Tobacco Use   Smoking status: Never   Smokeless tobacco: Never  Vaping Use   Vaping Use: Never used  Substance and Sexual Activity   Alcohol use: No   Drug use: No   Sexual activity: Yes  Other Topics Concern   Not on file  Social History Narrative   Married. 3 daughters 69, 42, and 10 in 2021.       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       Hobbies:crafters, reading, family time, music   Social Determinants of Health   Financial Resource Strain: Not on file  Food Insecurity: Not on file  Transportation Needs: Not on file  Physical Activity: Not on file  Stress: Not on file  Social Connections: Not on file  Intimate Partner Violence: Not on file     PHYSICAL EXAM GENERAL EXAM/CONSTITUTIONAL: Vitals:  Vitals:   04/09/21 0916  BP: (!) 135/91  Pulse: 71   Weight: 212 lb (96.2 kg)  Height: '5\' 8"'$  (1.727 m)   Body mass index is 32.23 kg/m. Wt Readings from Last 3 Encounters:  04/09/21 212 lb (96.2 kg)  03/28/21 217 lb (98.4 kg)  03/25/21 217 lb 11.2 oz (98.7 kg)   Patient is in no distress; well developed, nourished and groomed; neck in soft collar (had recent cervical spine surgery)  CARDIOVASCULAR: Examination of carotid arteries is normal; no carotid bruits Regular rate and rhythm, no murmurs Examination of peripheral vascular system by observation and palpation is normal  EYES: Pupils round and reactive to light, Visual fields full to confrontation, Extraocular movements intacts,   MUSCULOSKELETAL: Gait, strength, tone, movements noted in Neurologic  exam below  NEUROLOGIC: MENTAL STATUS:  awake, alert, oriented to person, place and time recent and remote memory intact normal attention and concentration language fluent, comprehension intact, naming intact fund of knowledge appropriate  CRANIAL NERVE:  2nd, 3rd, 4th, 6th - pupils equal and reactive to light, visual fields full to confrontation, extraocular muscles intact, no nystagmus 5th - facial sensation symmetric 7th - facial strength symmetric 8th - hearing intact 9th - palate elevates symmetrically, uvula midline 11th - shoulder shrug symmetric 12th - tongue protrusion midline  MOTOR:  normal bulk and tone, full strength. LUE is limited due to recent neck surgery and surgeon directive not to move left arm  SENSORY:  normal and symmetric to light touch, pinprick, temperature, vibration  COORDINATION:  finger-nose-finger, fine finger movements normal  REFLEXES:  deep tendon reflexes present and symmetric  GAIT/STATION:  normal     DIAGNOSTIC DATA (LABS, IMAGING, TESTING) - I reviewed patient records, labs, notes, testing and imaging myself where available.  Lab Results  Component Value Date   WBC 13.0 (H) 03/25/2021   HGB 16.0 (H) 03/25/2021   HCT  45.1 03/25/2021   MCV 87.9 03/25/2021   PLT 341 03/25/2021      Component Value Date/Time   NA 139 03/04/2021 0939   K 3.7 03/04/2021 0939   CL 99 03/04/2021 0939   CO2 23 03/04/2021 0939   GLUCOSE 96 03/04/2021 0939   GLUCOSE 109 (H) 01/27/2021 1038   BUN 15 03/04/2021 0939   CREATININE 0.87 03/04/2021 0939   CALCIUM 9.5 03/04/2021 0939   PROT 7.7 01/02/2021 1536   ALBUMIN 4.3 01/02/2021 1536   AST 29 01/02/2021 1536   ALT 24 01/02/2021 1536   ALKPHOS 74 01/02/2021 1536   BILITOT 0.3 01/02/2021 1536   GFRNONAA >60 01/27/2021 1038   Lab Results  Component Value Date   CHOL 184 10/27/2019   HDL 42.80 10/27/2019   LDLCALC 103 (H) 10/27/2019   TRIG 188.0 (H) 10/27/2019   CHOLHDL 4 10/27/2019   No results found for: HGBA1C No results found for: VITAMINB12 Lab Results  Component Value Date   TSH 3.22 01/02/2021    MRI 01/28/2021: 1. No evidence of acute intracranial abnormality. Specifically, no acute infarct. 2. Pericallosal lipoma. 3. 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 No adjacent brain edema or substantial mass effect. 4. Mild chronic microvascular ischemic disease.   MRA Head 01/28/2021: No large vessel occlusion or proximal hemodynamically significant stenosis. Small (1-2 mm) outpouching arising from the distal left M1 MCA appears to have a small vessel arising from its tip, compatible with an infundibulum. Small (1 mm) conical outpouching arising from the dista left l basilar artery with a small artery rising from the tip, compatible infundibulum.   MRA Neck: No significant (greater than 50%) stenosis  MRI Cervical spine 02/14/2021:  1. At C5-C6, large inferiorly dissecting left paracentral/foraminal disc protrusion with severe stenosis of the left aspect of the canal and left C5-C6 foramen. The cord is deformed and displaced  to the right. Moderate right foraminal stenosis. 2. At C6-C7, broad disc bulge which contacts and flattens the cord with mild to moderate canal stenosis and moderate right and mild left foraminal stenosis. 3. At C4-C5, moderate left foraminal stenosis.    ASSESSMENT AND PLAN  40 y.o. year old female with past medical history of chronic migraines,  hypertension and recent cervical spine discectomy and fusion for a severe cervical stenosis with radiculopathy who is presenting for worsening headache.  Patient said that since the month of May she has been having worsening headache, described headaches as diffuse pulsating pain sometime located in the back of the head.  At that time also she was having neck pain and left shoulder pain and numbness all the way down to her fingertips.  She had no MRI and MRA of the brain which did not show any acute finding but her cervical spine MRI showed a severe stenosis in the left C5-C6 region.  She is status post surgery about 2 weeks ago.  Reports that since her surgery her headaches frequency and intensity have decreased but she is not sure if this is due to the actual surgery or in fact due to the pain meds.  I will go ahead and start her on a preventive medication for her migraines, amitriptyline 25 mg to take for 1 week and if tolerated can increase it to 50 mg nightly.  I will also start her on a abortive medication sumatriptan to take as needed for headaches.  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  I explained to the patient that these are different f from aneurysm but we can monitor them with MRA in the future.    1. Migraine without aura and without status migrainosus, not intractable   2. Cervical stenosis of spinal canal   3. S/P cervical discectomy   4. Cervical radiculopathy     PLAN: Start with Amitriptyline 25 mg nightly for  migraine prevention, can increase to 50 mg nightly if tolerating the medication well (no daytime sleepiness)  Take Sumatriptan as needed for the migraines, can take a second tablet 2hrs later if the headaches not resolved but do not take more than 2 tablets in a 24 hrs period.  Follow up with your doctors as scheduled  Return to clinic in 3 months, at that time, we will re-discuss need for repeat MRA.   ----------------------------------------------------------------------------------------------------------------------------------------------- "An infundibulum (plural: infundibula) is a conical outpouching from an artery (usually intracranial), with a broad base narrowing to an apex from which a vessel originates. The most common location for an infundibulum is the origin of the posterior communicating artery (PCOM) from the supraclinoid internal carotid artery. They are common, found in up to a quarter of all cerebral angiograms.   The main importance of an infundibulum is that it may be mistaken for a saccular (berry) aneurysm (which is rounded and has the branch at its base). An infundibulum in most cases measures less than 3 mm. Unlike an aneurysm, an infundibulum is not believed to be a risk for rupture and subarachnoid hemorrhage. Only very rarely does an infundibulum eventually develop into an aneurysm and it is generally not thought that an incidental infundibulum requires follow-up unless additional clinical concern is present. Prudent factors that may indicate follow-up include large size, family history of subarachnoid hemorrhage or aneurysm, connective tissue disorders or history of dissection, or an aneurysm elsewhere"   Reference: Einar Grad, Ward Odette Fraction [facility], [place] RS. The progression of an infundibulum to aneurysm formation and rupture: case report and literature review. Neurosurgery. 43 (6): 1445-8; discussion 1448-9  No orders of the defined types were placed in this  encounter.   Meds ordered this encounter  Medications  amitriptyline (ELAVIL) 25 MG tablet    Sig: Take 1 tablet (25 mg total) by mouth at bedtime for 90 doses.    Dispense:  90 tablet    Refill:  0   SUMAtriptan (IMITREX) 50 MG tablet    Sig: Take 1 tablet (50 mg total) by mouth every 2 (two) hours as needed for migraine. May repeat in 2 hours if headache persists or recurs.    Dispense:  10 tablet    Refill:  3    Return in about 3 months (around 07/10/2021).    Alric Ran, MD 04/09/2021, 10:45 AM  [facility] [address], [place], [place] [address] [phone]

## 2021-04-09 NOTE — Patient Instructions (Signed)
Start with Amitriptyline 25 mg nightly for migraine prevention, can increase to 50 mg nightly if tolerating the medication well (no daytime sleepiness)  Take Sumatriptan as needed for the migraines, can take a second tablet 2hrs later if the headaches not resolved but do not take more than 2 tablets in a 24 hrs period.  Follow up with your doctors as scheduled  Return to clinic in 3 months, at that time, we will re-discuss need for repeat MRA.

## 2021-04-22 ENCOUNTER — Ambulatory Visit: Payer: No Typology Code available for payment source | Admitting: Cardiology

## 2021-04-28 ENCOUNTER — Ambulatory Visit: Payer: Self-pay | Admitting: Neurology

## 2021-04-30 ENCOUNTER — Ambulatory Visit: Payer: Self-pay | Admitting: Neurology

## 2021-05-06 NOTE — Patient Instructions (Addendum)
Please bring your home cuff at your next visit. Goal at home <135/85 if not trending down on home readings prior to physical can always schedule sooner visit. If you start getting down to 110/70 or less or note lightheadedness/dizziness as you lose weight please let me know.   I strongly encourage you to make dietary changes, work on weight loss, and exercise to you daily lifestyle to help lower your cholesterol. I also think with aggressive monitoring of you blood pressure with help with this as well.  I am thrilled to see you are doing well!  Recommended follow up: Return for Please schedule your next physical for September 29, 2021 or later.  Or sooner if BP not improving

## 2021-05-06 NOTE — Progress Notes (Signed)
Phone [phone] In person visit   Subjective:   [name] is a 40 y.o. year old very pleasant female patient who presents for/with See problem oriented charting Chief Complaint  Patient presents with   Hypertension    This visit occurred during the SARS-CoV-2 public health emergency.  Safety protocols were in place, including screening questions prior to the visit, additional usage of staff PPE, and extensive cleaning of exam room while observing appropriate contact time as indicated for disinfecting solutions.   Past Medical History-  Patient Active Problem List   Diagnosis Date Noted   Hyperlipidemia 02/10/2021    Priority: Medium   Dysmenorrhea 09/29/2019    Priority: Medium   Dysplastic nevi 11/29/2012    Priority: Low   Polyp of anterior nasal cavity 03/02/2012    Priority: Low   Atopic dermatitis 11/26/2009    Priority: Low   DISC DISEASE, CERVICAL 04/19/2008    Priority: Low   PLANTAR FASCIITIS 04/19/2008    Priority: Low   Cervical radiculopathy at C6 03/28/2021   Essential hypertension 03/04/2021   Chicken pox 03/03/2021   Atypical chest pain 10/11/2012    Medications- reviewed and updated Current Outpatient Medications  Medication Sig Dispense Refill   albuterol (VENTOLIN HFA) 108 (90 Base) MCG/ACT inhaler Inhale 2 puffs into the lungs every 6 (six) hours as needed for wheezing or shortness of breath. 1 each 0   amitriptyline (ELAVIL) 25 MG tablet Take 1 tablet (25 mg total) by mouth at bedtime for 90 doses. 90 tablet 0   Drospirenone 4 MG TABS Take 4 mg by mouth daily.     Pediatric Multivit-Minerals-C (MULTIVITAMIN CHILDRENS GUMMIES PO) Take 2 each by mouth daily.     SUMAtriptan (IMITREX) 50 MG tablet Take 1 tablet (50 mg total) by mouth every 2 (two) hours as needed for migraine. May repeat in 2 hours if headache persists or recurs. 10 tablet 3   amLODipine (NORVASC) 2.5 MG tablet Take 1 tablet (2.5 mg total) by mouth daily. 90 tablet 3    hydrochlorothiazide (MICROZIDE) 12.5 MG capsule Take 1 capsule (12.5 mg total) by mouth daily. 90 capsule 3   No current facility-administered medications for this visit.     Objective:  BP 120/85   Pulse 83   Temp 98.3 F (36.8 C) (Temporal)   Ht 5\' 8"  (1.727 m)   Wt 218 lb (98.9 kg)   SpO2 95%   BMI 33.15 kg/m  Gen: NAD, resting comfortably CV: RRR no murmurs rubs or gallops Lungs: CTAB no crackles, wheeze, rhonchi Abdomen: soft/nontender/nondistended/normal bowel sounds.  Ext: no edema Skin: warm, dry    Assessment and Plan  # Hospital F/U for Anterior Discectomy and Fusion S:Patient was seen on 03/28/21 at [facility] with a history of left upper extremity numbness for greater than 1 year- this has chronically worsened over a few months.   Per neurosurgery- Workup  revealed severe stenosis at C5-6 and C6-7 due to foraminal disc herniation and paracentral disc herniation. Patient did have chronic C6 and C7 radiculopathy. Discussed C5-6, C6-7 arthroplasty versus ACDF and recommended arthroplasty given her minimal spondylosis.  Patient was admitted 03/29/21 to the hospital and underwent an uncomplicated anterior cervical discectomy and fusion. Post operation patient was doing very well- preoperative neck and upper extremity symptoms were much improved, strength and sensation returned to norma, was walking without difficulty. She was then discharged.  Today patient reports much improved pain at 1/10.  A/P: much improved-  continue follow up with neurosurgery  - no atypical chest pain after surgery- had been left upper chest  #hypertension S: medication: amlodipine 2.5 mg daily at night, hctz in morning. No edema once restarted hctz Home readings #s: home readings max 139- is getting some 90s on the bottom # - have not checked home cuff BP Readings from Last 3 Encounters:  05/08/21 120/85  04/09/21 (!) 135/91  03/29/21 116/79  A/P: Blood pressure  is reasonably controlled in the office.  We discussed bringing home cuff to next visit and we can see if it is running slightly higher than our readings-as she is getting some numbers in the 58K on diastolic readings at home-mild poor control.  For now we will continue current medication and reevaluate at next visit.   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  I explained to the patient that these are different  from aneurysm but we can monitor them with MRA in the future. " -  prior to  visit on 04/09/2021 with neurology frequency/intensity of headaches had decreased - not sure if it was the surgery or the pain medications she was on.  She was to be started on amitriptyline 25 mg for one week - if tolerable she could increase to 50 mg nightly. Patient was to also start Imitrex 50 mg and to take this as needed for headaches.   She is sleeping well on this amitriptyline 25 mg. Continued posterior dull headaches maybe 2-3/10 at worst- has been on it 2 weeks and is noting some improvement. No severe headaches and has not needed sumatriptan A/P: migraines improving- continue neurology follow up and current meds   #hyperlipidemia S: Medication:none  Lab Results  Component Value Date   CHOL 184 10/27/2019   HDL 42.80 10/27/2019   LDLCALC 103 (H) 10/27/2019   TRIG 188.0 (H) 10/27/2019   CHOLHDL 4 10/27/2019   A/P: Patient with coronary calcium score of 0.38 on CT angiogram.  This is very minimal but is also still real disease-we discussed importance of working on healthy eating/regular exercise/weight loss to try to get LDL down under 70.  A plant based  whole food diet is strongly recommended with minimal processed foods. Also continue aggressive BP control.   Recommended follow up: schedule cpe 09/29/21 or later unless BP increases Future Appointments  Date Time Provider [place]  06/02/2021  9:20 AM Park Liter, MD [facility] None  07/16/2021 11:00 AM Alric Ran, MD [facility] None    Lab/Order associations:   ICD-10-CM   1. Essential hypertension  I10     2. Cervical radiculopathy at C6  M54.12     3. Migraine without aura and without status migrainosus, not intractable  G43.009       Meds ordered this encounter  Medications   hydrochlorothiazide (MICROZIDE) 12.5 MG capsule    Sig: Take 1 capsule (12.5 mg total) by mouth daily.    Dispense:  90 capsule    Refill:  3   amLODipine (NORVASC) 2.5 MG tablet    Sig: Take 1 tablet (2.5 mg total) by mouth daily.    Dispense:  90 tablet    Refill:  3   I,Harris Phan,acting as a Education administrator for Garret Reddish, MD.,have documented all relevant documentation on the behalf of Garret Reddish, MD,as directed by  Garret Reddish, MD while in the presence of Garret Reddish, MD.  I, Garret Reddish, MD, have reviewed all documentation for this visit. The documentation on 05/08/21 for the exam, diagnosis, procedures, and orders are all accurate and complete.   Return precautions advised.  Garret Reddish, MD

## 2021-05-08 ENCOUNTER — Other Ambulatory Visit: Payer: Self-pay

## 2021-05-08 ENCOUNTER — Ambulatory Visit (INDEPENDENT_AMBULATORY_CARE_PROVIDER_SITE_OTHER): Payer: No Typology Code available for payment source | Admitting: Family Medicine

## 2021-05-08 ENCOUNTER — Encounter: Payer: Self-pay | Admitting: Family Medicine

## 2021-05-08 VITALS — BP 120/85 | HR 83 | Temp 98.3°F | Ht 68.0 in | Wt 218.0 lb

## 2021-05-08 DIAGNOSIS — I10 Essential (primary) hypertension: Secondary | ICD-10-CM | POA: Diagnosis not present

## 2021-05-08 DIAGNOSIS — M5412 Radiculopathy, cervical region: Secondary | ICD-10-CM | POA: Diagnosis not present

## 2021-05-08 DIAGNOSIS — G43009 Migraine without aura, not intractable, without status migrainosus: Secondary | ICD-10-CM

## 2021-05-08 DIAGNOSIS — E785 Hyperlipidemia, unspecified: Secondary | ICD-10-CM

## 2021-05-08 MED ORDER — AMLODIPINE BESYLATE 2.5 MG PO TABS: 2.5000 mg | ORAL_TABLET | Freq: Every day | ORAL | 3 refills | Status: DC

## 2021-05-08 MED ORDER — HYDROCHLOROTHIAZIDE 12.5 MG PO CAPS: 12.5000 mg | ORAL_CAPSULE | Freq: Every day | ORAL | 3 refills | Status: DC

## 2021-05-26 DIAGNOSIS — M25519 Pain in unspecified shoulder: Secondary | ICD-10-CM | POA: Insufficient documentation

## 2021-05-26 DIAGNOSIS — G8929 Other chronic pain: Secondary | ICD-10-CM | POA: Insufficient documentation

## 2021-06-02 ENCOUNTER — Ambulatory Visit: Payer: No Typology Code available for payment source | Admitting: Cardiology

## 2021-06-17 ENCOUNTER — Encounter: Payer: Self-pay | Admitting: Cardiology

## 2021-06-17 ENCOUNTER — Other Ambulatory Visit: Payer: Self-pay

## 2021-06-17 ENCOUNTER — Ambulatory Visit (INDEPENDENT_AMBULATORY_CARE_PROVIDER_SITE_OTHER): Payer: No Typology Code available for payment source | Admitting: Cardiology

## 2021-06-17 VITALS — BP 126/72 | HR 89 | Ht 68.0 in | Wt 226.2 lb

## 2021-06-17 DIAGNOSIS — G8929 Other chronic pain: Secondary | ICD-10-CM

## 2021-06-17 DIAGNOSIS — I10 Essential (primary) hypertension: Secondary | ICD-10-CM | POA: Diagnosis not present

## 2021-06-17 DIAGNOSIS — M25512 Pain in left shoulder: Secondary | ICD-10-CM

## 2021-06-17 DIAGNOSIS — E782 Mixed hyperlipidemia: Secondary | ICD-10-CM

## 2021-06-17 DIAGNOSIS — R0789 Other chest pain: Secondary | ICD-10-CM

## 2021-06-17 DIAGNOSIS — M5412 Radiculopathy, cervical region: Secondary | ICD-10-CM

## 2021-06-17 NOTE — Progress Notes (Signed)
Cardiology Office Note:    Date:  06/17/2021   ID:  [name], DOB Apr 03, 1981, MRN [id number]  PCP:  Marin Olp, MD  Cardiologist:  Jenne Campus, MD    Referring MD: Marin Olp, MD   Chief Complaint  Patient presents with   Improved     But still having some chest discomfort    History of Present Illness:    [name] is a 40 y.o. female who was referred to [place] for elective evaluation before neck surgery.  That evaluation included echocardiogram which showed preserved left ventricle ejection fraction, she also had coronary CT angio done which showed 0 to 25% narrowing.  Overall severity mild disease.  She comes here to discuss that.  She did have surgery with no difficulties.  Still complain of some heaviness in the chest that happen not related to exercise not related to emotions that happens at any time.  Still recovering from surgery overall she is very happy and satisfied with results of surgery her arm pain getting much better.  Past Medical History:  Diagnosis Date   Chest pain    Chicken pox    Chronic headaches    Hypertension    Shoulder pain    UTI (urinary tract infection)     Past Surgical History:  Procedure Laterality Date   CERVICAL DISC ARTHROPLASTY N/A 03/28/2021   Procedure: Artifical disc replacement, cervical five -six, cervical six-seven;  Surgeon: Karsten Ro, DO;  Location: [facility];  Service: Neurosurgery;  Laterality: N/A;  3C   CESAREAN SECTION     2002,2007,2010   TUBAL LIGATION  2010    Current Medications: Current Meds  Medication Sig   albuterol (VENTOLIN HFA) 108 (90 Base) MCG/ACT inhaler Inhale 2 puffs into the lungs every 6 (six) hours as needed for wheezing or shortness of breath.   amitriptyline (ELAVIL) 25 MG tablet Take 1 tablet (25 mg total) by mouth at bedtime for 90 doses.   amLODipine (NORVASC) 2.5 MG tablet Take 1 tablet (2.5 mg total) by mouth daily.   Drospirenone 4 MG TABS Take 4 mg by mouth daily.    hydrochlorothiazide (MICROZIDE) 12.5 MG capsule Take 1 capsule (12.5 mg total) by mouth daily.   Pediatric Multivit-Minerals-C (MULTIVITAMIN CHILDRENS GUMMIES PO) Take 2 each by mouth daily. Unknown strength   SUMAtriptan (IMITREX) 50 MG tablet Take 1 tablet (50 mg total) by mouth every 2 (two) hours as needed for migraine. May repeat in 2 hours if headache persists or recurs.     Allergies:   Patient has no known allergies.   Social History   Socioeconomic History   Marital status: Married    Spouse name: Not on file   Number of children: 3   Years of education: 12   Highest education level: Not on file  Occupational History   Not on file  Tobacco Use   Smoking status: Never   Smokeless tobacco: Never  Vaping Use   Vaping Use: Never used  Substance and Sexual Activity   Alcohol use: No   Drug use: No   Sexual activity: Yes  Other Topics Concern   Not on file  Social History Narrative   Married. 3 daughters 107, 12, and 10 in 2021.       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       Hobbies:crafters, reading, family time, music   Social Determinants of Health   Financial Resource Strain: Not on  file  Food Insecurity: Not on file  Transportation Needs: Not on file  Physical Activity: Not on file  Stress: Not on file  Social Connections: Not on file     Family History: The patient's family history includes Asthma in her mother; Diabetes in her mother; Healthy in her father; Kidney Stones in her brother, brother, and sister; Other in her maternal grandmother. ROS:   Please see the history of present illness.    All 14 point review of systems negative except as described per history of present illness  EKGs/Labs/Other Studies Reviewed:      Recent Labs: 01/02/2021: ALT 24; TSH 3.22 03/04/2021: BUN 15; Creatinine, Ser 0.87; Potassium 3.7; Sodium 139 03/25/2021: Hemoglobin 16.0; Platelets 341  Recent Lipid Panel    Component Value Date/Time   CHOL 184  10/27/2019 0926   TRIG 188.0 (H) 10/27/2019 0926   HDL 42.80 10/27/2019 0926   CHOLHDL 4 10/27/2019 0926   VLDL 37.6 10/27/2019 0926   LDLCALC 103 (H) 10/27/2019 0926    Physical Exam:    VS:  BP 126/72 (BP Location: Left Arm, Patient Position: Sitting)   Pulse 89   Ht 5\' 8"  (1.727 m)   Wt 226 lb 3.2 oz (102.6 kg)   SpO2 96%   BMI 34.39 kg/m     Wt Readings from Last 3 Encounters:  06/17/21 226 lb 3.2 oz (102.6 kg)  05/08/21 218 lb (98.9 kg)  04/09/21 212 lb (96.2 kg)     GEN:  Well nourished, well developed in no acute distress HEENT: Normal NECK: No JVD; No carotid bruits LYMPHATICS: No lymphadenopathy CARDIAC: RRR, no murmurs, no rubs, no gallops RESPIRATORY:  Clear to auscultation without rales, wheezing or rhonchi  ABDOMEN: Soft, non-tender, non-distended MUSCULOSKELETAL:  No edema; No deformity  SKIN: Warm and dry LOWER EXTREMITIES: no swelling NEUROLOGIC:  Alert and oriented x 3 PSYCHIATRIC:  Normal affect   ASSESSMENT:    1. Atypical chest pain   2. Chronic left shoulder pain   3. Cervical radiculopathy at C6   4. Essential hypertension   5. Mixed hyperlipidemia    PLAN:    In order of problems listed above:  Atypical chest pain.  Coronary CT angio did not show any significant obstructive disease.  We will continue risk factors modifications. Dyslipidemia: I will ask her to have fasting lipid profile done and then will decide about therapy for her dyslipidemia last results I have is from 2021 with her LDL being 103.  This is reviewing from [facility]. Cervical radiculopathy at C6.  Recent surgery recovering nicely. Essential hypertension doing well. We had a brief discussion about healthy lifestyle need to exercise and regular basis good diet   Medication Adjustments/Labs and Tests Ordered: Current medicines are reviewed at length with the patient today.  Concerns regarding medicines are outlined above.  No orders of the defined types were placed in this  encounter.  Medication changes: No orders of the defined types were placed in this encounter.   Signed, Park Liter, MD, [facility] 06/17/2021 11:19 AM    [place]

## 2021-06-17 NOTE — Patient Instructions (Signed)
Medication Instructions:  Your physician recommends that you continue on your current medications as directed. Please refer to the Current Medication list given to you today.  *If you need a refill on your cardiac medications before your next appointment, please call your pharmacy*   Lab Work: Your physician recommends that you return for lab work fasting: lipid  If you have labs (blood work) drawn today and your tests are completely normal, you will receive your results only by: [facility] (if you have MyChart) OR A paper copy in the mail If you have any lab test that is abnormal or we need to change your treatment, we will call you to review the results.   Testing/Procedures: None   Follow-Up: At [facility], [place], you and your health needs are our priority.  As part of our continuing mission to provide you with exceptional heart care, we have created designated Provider Care Teams.  These Care Teams include your primary Cardiologist (physician) and Advanced Practice Providers (APPs -  Physician Assistants and Nurse Practitioners) who all work together to provide you with the care you need, when you need it.  We recommend signing up for the patient portal called "MyChart".  Sign up information is provided on this After Visit Summary.  MyChart is used to connect with patients for Virtual Visits (Telemedicine).  Patients are able to view lab/test results, encounter notes, upcoming appointments, etc.  Non-urgent messages can be sent to your provider as well.   To learn more about what you can do with MyChart, go to [email or web address].    Your next appointment:   1 year(s)  The format for your next appointment:   In Person  Provider:   Jenne Campus, MD   Other Instructions

## 2021-06-17 NOTE — Addendum Note (Signed)
Addended by: Senaida Ores on: 06/17/2021 11:25 AM   Modules accepted: Orders

## 2021-06-30 ENCOUNTER — Other Ambulatory Visit: Payer: Self-pay | Admitting: Neurology

## 2021-07-16 ENCOUNTER — Encounter: Payer: Self-pay | Admitting: Neurology

## 2021-07-16 ENCOUNTER — Ambulatory Visit (INDEPENDENT_AMBULATORY_CARE_PROVIDER_SITE_OTHER): Payer: No Typology Code available for payment source | Admitting: Neurology

## 2021-07-16 VITALS — BP 137/84 | HR 93 | Ht 68.0 in | Wt 220.0 lb

## 2021-07-16 DIAGNOSIS — Z9889 Other specified postprocedural states: Secondary | ICD-10-CM

## 2021-07-16 DIAGNOSIS — G43009 Migraine without aura, not intractable, without status migrainosus: Secondary | ICD-10-CM

## 2021-07-16 MED ORDER — AMITRIPTYLINE HCL 10 MG PO TABS: 10.0000 mg | ORAL_TABLET | Freq: Every day | ORAL | 4 refills | Status: DC

## 2021-07-16 NOTE — Progress Notes (Signed)
[facility]  PATIENT: [name] DOB: 1980/12/09  REFERRING CLINICIAN: Marin Olp, MD HISTORY FROM: Patient  REASON FOR VISIT: Headaches/Neck pain   HISTORICAL  CHIEF COMPLAINT:  Chief Complaint  Patient presents with   Follow-up    Rm 13, alone Reports migraines have decreased in frequency and less intense , 3-4 a month,     INTERVAL HISTORY 07/16/2021: Patient presents today for follow up. Reports that headaches are better, Elavil works well for her but makes her sleepy. Report stop taking Elavil currently due to increase work demand and she wants to be productive, currently having 3 to 4 dull headaches per week. Will restart the Elavil next week but at a lower dose.  No other concerns or complaints.    HISTORY OF PRESENT ILLNESS:  This is a 40 year old woman with past medical history of hypertension, chronic headaches who is presenting for headaches, neck pain and left shoulder pain and numbness.  For her headaches patient reported she has been having headache for about 20 years, described the headaches are all over her head, pulsatile pain.  For headache she has tried ibuprofen with some minimal relief, reported that 1 her doctor previously had put her on a abortive medication possibly sumatriptan but she has never been on a preventive medication.  She does have sensitivity to light with her headaches but no nausea no vomiting no photophobia.  She does not describe any auras.  Patient reports in the month of May-June she has been having worsening headache, in the back of the head with worsening neck pain and left shoulder pain and numbness of the left arm going down to her fingertips.  She had MRI and MRA of the head and also MRI of the neck which showed a large dissecting left paracentral foraminal disc protrusion with severe stenosis of the left aspect of the canal. The cord is deformed and displaced to the right.  She is status post cervical disc  fusion about 2 weeks ago.  Reported since her surgery her headaches are still there but they are mild not sure it is related to the surgery itself or related to the pain medication that she is taking for the surgery.  She is also here to discuss her Brain MRI results.    Headache History and Characteristics: Onset: 20 years but worse since May Location: Diffuse Quality: Pulsating  Intensity: 7/10.  Migrainous Features: Photophobia, phonophobia, nausea, vomiting.  Aura: No  History of brain injury or tumor: No  Family history: 56 yo Daughter with headaches  Motion sickness: no Cardiac history: no  OTC: tylenol, codeine Caffeine: soda once a week  Sleep: Will get about 4 to 5 hours per night  Mood/ Stress: Stress is low, mood is fine   Prior prophylaxis: Propranolol: No  Verapamil:No TCA: No Topamax: No Depakote: No Effexor: No Cymbalta: No Neurontin:No  Prior abortives: Triptan: Possibly Anti-emetic: No Steroids: No Ergotamine suppository: No  Prior interventions: None   OTHER MEDICAL CONDITIONS: Chronic headaches, hypertension,    REVIEW OF SYSTEMS: Full 14 system review of systems performed and negative with exception of: as noted in the HPI  ALLERGIES: No Known Allergies  HOME MEDICATIONS: Outpatient Medications Prior to Visit  Medication Sig Dispense Refill   albuterol (VENTOLIN HFA) 108 (90 Base) MCG/ACT inhaler Inhale 2 puffs into the lungs every 6 (six) hours as needed for wheezing or shortness of breath. 1 each 0   amLODipine (NORVASC) 2.5 MG tablet Take 1 tablet (  2.5 mg total) by mouth daily. 90 tablet 3   Drospirenone 4 MG TABS Take 4 mg by mouth daily.     hydrochlorothiazide (MICROZIDE) 12.5 MG capsule Take 1 capsule (12.5 mg total) by mouth daily. 90 capsule 3   Pediatric Multivit-Minerals-C (MULTIVITAMIN CHILDRENS GUMMIES PO) Take 2 each by mouth daily. Unknown strength     SUMAtriptan (IMITREX) 50 MG tablet Take 1 tablet (50 mg total) by mouth  every 2 (two) hours as needed for migraine. May repeat in 2 hours if headache persists or recurs. 10 tablet 3   amitriptyline (ELAVIL) 25 MG tablet TAKE 1 TABLET (25 MG TOTAL) BY MOUTH AT BEDTIME 90 tablet 0   No facility-administered medications prior to visit.    PAST MEDICAL HISTORY: Past Medical History:  Diagnosis Date   Chest pain    Chicken pox    Chronic headaches    Hypertension    Shoulder pain    UTI (urinary tract infection)     PAST SURGICAL HISTORY: Past Surgical History:  Procedure Laterality Date   CERVICAL DISC ARTHROPLASTY N/A 03/28/2021   Procedure: Artifical disc replacement, cervical five -six, cervical six-seven;  Surgeon: Karsten Ro, DO;  Location: [place];  Service: Neurosurgery;  Laterality: N/A;  3C   CESAREAN SECTION     2002,2007,2010   TUBAL LIGATION  2010    FAMILY HISTORY: Family History  Problem Relation Age of Onset   Asthma Mother    Diabetes Mother    Healthy Father    Kidney Stones Brother    Kidney Stones Sister    Kidney Stones Brother    Other Maternal Grandmother        42 years old in 2021    SOCIAL HISTORY: Social History   Socioeconomic History   Marital status: Married    Spouse name: Not on file   Number of children: 3   Years of education: 12   Highest education level: Not on file  Occupational History   Not on file  Tobacco Use   Smoking status: Never   Smokeless tobacco: Never  Vaping Use   Vaping Use: Never used  Substance and Sexual Activity   Alcohol use: No   Drug use: No   Sexual activity: Yes  Other Topics Concern   Not on file  Social History Narrative   Married. 3 daughters 34, 77, and 10 in 2021.       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       Hobbies:crafters, reading, family time, music   Social Determinants of Health   Financial Resource Strain: Not on file  Food Insecurity: Not on file  Transportation Needs: Not on file  Physical Activity: Not on file  Stress: Not on  file  Social Connections: Not on file  Intimate Partner Violence: Not on file     PHYSICAL EXAM GENERAL EXAM/CONSTITUTIONAL: Vitals:  Vitals:   07/16/21 1050  BP: 137/84  Pulse: 93  Weight: 220 lb (99.8 kg)  Height: 5\' 8"  (1.727 m)   Body mass index is 33.45 kg/m. Wt Readings from Last 3 Encounters:  07/16/21 220 lb (99.8 kg)  06/17/21 226 lb 3.2 oz (102.6 kg)  05/08/21 218 lb (98.9 kg)   Patient is in no distress; well developed, nourished and groomed; neck in soft collar (had recent cervical spine surgery)  EYES: Pupils round and reactive to light, Visual fields full to confrontation, Extraocular movements intacts,   MUSCULOSKELETAL: Gait, strength, tone,  movements noted in Neurologic exam below  NEUROLOGIC: MENTAL STATUS:  awake, alert, oriented to person, place and time recent and remote memory intact normal attention and concentration language fluent, comprehension intact, naming intact fund of knowledge appropriate  CRANIAL NERVE:  2nd, 3rd, 4th, 6th - pupils equal and reactive to light, visual fields full to confrontation, extraocular muscles intact, no nystagmus 5th - facial sensation symmetric 7th - facial strength symmetric 8th - hearing intact 9th - palate elevates symmetrically, uvula midline 11th - shoulder shrug symmetric 12th - tongue protrusion midline  MOTOR:  normal bulk and tone, full strength. LUE is limited due to recent neck surgery and surgeon directive not to move left arm  SENSORY:  normal and symmetric to light touch, pinprick, temperature, vibration  COORDINATION:  finger-nose-finger, fine finger movements normal  REFLEXES:  deep tendon reflexes present and symmetric  GAIT/STATION:  normal     DIAGNOSTIC DATA (LABS, IMAGING, TESTING) - I reviewed patient records, labs, notes, testing and imaging myself where available.  Lab Results  Component Value Date   WBC 13.0 (H) 03/25/2021   HGB 16.0 (H) 03/25/2021   HCT 45.1  03/25/2021   MCV 87.9 03/25/2021   PLT 341 03/25/2021      Component Value Date/Time   NA 139 03/04/2021 0939   K 3.7 03/04/2021 0939   CL 99 03/04/2021 0939   CO2 23 03/04/2021 0939   GLUCOSE 96 03/04/2021 0939   GLUCOSE 109 (H) 01/27/2021 1038   BUN 15 03/04/2021 0939   CREATININE 0.87 03/04/2021 0939   CALCIUM 9.5 03/04/2021 0939   PROT 7.7 01/02/2021 1536   ALBUMIN 4.3 01/02/2021 1536   AST 29 01/02/2021 1536   ALT 24 01/02/2021 1536   ALKPHOS 74 01/02/2021 1536   BILITOT 0.3 01/02/2021 1536   GFRNONAA >60 01/27/2021 1038   Lab Results  Component Value Date   CHOL 184 10/27/2019   HDL 42.80 10/27/2019   LDLCALC 103 (H) 10/27/2019   TRIG 188.0 (H) 10/27/2019   CHOLHDL 4 10/27/2019   No results found for: HGBA1C No results found for: VITAMINB12 Lab Results  Component Value Date   TSH 3.22 01/02/2021    MRI 01/28/2021: 1. No evidence of acute intracranial abnormality. Specifically, no acute infarct. 2. Pericallosal lipoma. 3. 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 No adjacent brain edema or substantial mass effect. 4. Mild chronic microvascular ischemic disease.   MRA Head 01/28/2021: No large vessel occlusion or proximal hemodynamically significant stenosis. Small (1-2 mm) outpouching arising from the distal left M1 MCA appears to have a small vessel arising from its tip, compatible with an infundibulum. Small (1 mm) conical outpouching arising from the dista left l basilar artery with a small artery rising from the tip, compatible infundibulum.   MRA Neck: No significant (greater than 50%) stenosis  MRI Cervical spine 02/14/2021:  1. At C5-C6, large inferiorly dissecting left paracentral/foraminal disc protrusion with severe stenosis of the left aspect of the canal and left C5-C6 foramen. The cord is deformed and displaced to  the right. Moderate right foraminal stenosis. 2. At C6-C7, broad disc bulge which contacts and flattens the cord with mild to moderate canal stenosis and moderate right and mild left foraminal stenosis. 3. At C4-C5, moderate left foraminal stenosis.    ASSESSMENT AND PLAN  40 y.o. year old female with past medical  history of chronic migraines, hypertension and recent cervical spine discectomy and fusion for a severe cervical stenosis presents today for follow-up.  Patient stated that headaches improved markedly with Elavil, but the medication makes her sleepy.  Due to high demand at work she has temporarily discontinued her Elavil which has caused her headaches to return, currently she is having 3-4 headache days per week, described them as dull headache.  She will return to Elavil but at a lower dose, 10 mg nightly.  Overall she is satisfied with her current headache intensity and frequency.  Follow-up in 1 year.  At that time we will discuss about repeat MRA head for those incidental infundibulum seen on imaging.   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  I explained to the patient that these are different f from aneurysm but we can monitor them with MRA in the future.    1. Migraine without aura and without status migrainosus, not intractable   2. S/P cervical discectomy      PLAN: Start with Amitriptyline 25 mg nightly for migraine prevention, can increase to 50 mg nightly if tolerating the medication well (no daytime sleepiness)  Take Sumatriptan as needed for the migraines, can take a second tablet 2hrs later if the headaches not resolved but do not take more than 2 tablets in a 24 hrs period.  Follow up with your doctors as scheduled  Return to clinic in 3 months, at that time, we will re-discuss need for repeat  MRA.   ----------------------------------------------------------------------------------------------------------------------------------------------- "An infundibulum (plural: infundibula) is a conical outpouching from an artery (usually intracranial), with a broad base narrowing to an apex from which a vessel originates. The most common location for an infundibulum is the origin of the posterior communicating artery (PCOM) from the supraclinoid internal carotid artery. They are common, found in up to a quarter of all cerebral angiograms.   The main importance of an infundibulum is that it may be mistaken for a saccular (berry) aneurysm (which is rounded and has the branch at its base). An infundibulum in most cases measures less than 3 mm. Unlike an aneurysm, an infundibulum is not believed to be a risk for rupture and subarachnoid hemorrhage. Only very rarely does an infundibulum eventually develop into an aneurysm and it is generally not thought that an incidental infundibulum requires follow-up unless additional clinical concern is present. Prudent factors that may indicate follow-up include large size, family history of subarachnoid hemorrhage or aneurysm, connective tissue disorders or history of dissection, or an aneurysm elsewhere"   Reference: Einar Grad, Ward Odette Fraction [facility], [place]os[place] RS. The progression of an infundibulum to aneurysm formation and rupture: case report and literature review. Neurosurgery. 43 (6): 1445-8; discussion 1448-9  No orders of the defined types were placed in this encounter.   Meds ordered this encounter  Medications   amitriptyline (ELAVIL) 10 MG tablet    Sig: Take 1 tablet (10 mg total) by mouth at bedtime.    Dispense:  90 tablet    Refill:  4     Return in about 1 year (around 07/16/2022).    Alric Ran, MD 07/16/2021, 11:27 AM  [facility] [address], [place], [place] [address] [phone]

## 2021-07-16 NOTE — Patient Instructions (Signed)
Continue with Elavil nightly  Follow up in one year

## 2021-08-27 ENCOUNTER — Telehealth: Payer: Self-pay | Admitting: Family Medicine

## 2021-08-27 NOTE — Telephone Encounter (Signed)
Didn't see anything about this from the 05/08/21 visit. Ok to place referral? If so, where?

## 2021-08-27 NOTE — Telephone Encounter (Signed)
Pt called in about a referral for hair loss. Pt stated they previously talked to Dr Yong Channel regarding this issue but pushed it aside due to surgery and other health issues. She is wanting to know if she can still have the referral they talked about.

## 2021-09-03 NOTE — Telephone Encounter (Signed)
Patient has called back in regard.  States she had scheduled with [facility] but had to cancel due to having to have sinus surgery.  Patient states she is ready to reschedule.  I have given her the phone number to [facility] to reschedule.

## 2022-01-19 ENCOUNTER — Encounter: Payer: Self-pay | Admitting: Family Medicine

## 2022-01-19 ENCOUNTER — Ambulatory Visit (INDEPENDENT_AMBULATORY_CARE_PROVIDER_SITE_OTHER): Payer: No Typology Code available for payment source | Admitting: Family Medicine

## 2022-01-19 VITALS — BP 110/80 | HR 80 | Temp 98.1°F | Ht 68.0 in | Wt 230.4 lb

## 2022-01-19 DIAGNOSIS — Z0000 Encounter for general adult medical examination without abnormal findings: Secondary | ICD-10-CM | POA: Diagnosis not present

## 2022-01-19 DIAGNOSIS — E782 Mixed hyperlipidemia: Secondary | ICD-10-CM | POA: Diagnosis not present

## 2022-01-19 DIAGNOSIS — I10 Essential (primary) hypertension: Secondary | ICD-10-CM | POA: Diagnosis not present

## 2022-01-19 LAB — CBC WITH DIFFERENTIAL/PLATELET
Basophils Absolute: 0.1 10*3/uL (ref 0.0–0.1)
Basophils Relative: 1.1 % (ref 0.0–3.0)
Eosinophils Absolute: 0.1 10*3/uL (ref 0.0–0.7)
Eosinophils Relative: 1.3 % (ref 0.0–5.0)
HCT: 41.6 % (ref 36.0–46.0)
Hemoglobin: 14.3 g/dL (ref 12.0–15.0)
Lymphocytes Relative: 36.4 % (ref 12.0–46.0)
Lymphs Abs: 2.6 10*3/uL (ref 0.7–4.0)
MCHC: 34.5 g/dL (ref 30.0–36.0)
MCV: 88.9 fl (ref 78.0–100.0)
Monocytes Absolute: 0.3 10*3/uL (ref 0.1–1.0)
Monocytes Relative: 4.3 % (ref 3.0–12.0)
Neutro Abs: 4.1 10*3/uL (ref 1.4–7.7)
Neutrophils Relative %: 56.9 % (ref 43.0–77.0)
Platelets: 251 10*3/uL (ref 150.0–400.0)
RBC: 4.67 Mil/uL (ref 3.87–5.11)
RDW: 13.1 % (ref 11.5–15.5)
WBC: 7.2 10*3/uL (ref 4.0–10.5)

## 2022-01-19 LAB — COMPREHENSIVE METABOLIC PANEL
ALT: 21 U/L (ref 0–35)
AST: 20 U/L (ref 0–37)
Albumin: 4.2 g/dL (ref 3.5–5.2)
Alkaline Phosphatase: 82 U/L (ref 39–117)
BUN: 11 mg/dL (ref 6–23)
CO2: 26 mEq/L (ref 19–32)
Calcium: 9.2 mg/dL (ref 8.4–10.5)
Chloride: 105 mEq/L (ref 96–112)
Creatinine, Ser: 0.73 mg/dL (ref 0.40–1.20)
GFR: 101.11 mL/min (ref >60.00)
Glucose, Bld: 84 mg/dL (ref 70–99)
Potassium: 3.6 mEq/L (ref 3.5–5.1)
Sodium: 140 mEq/L (ref 135–145)
Total Bilirubin: 0.4 mg/dL (ref 0.2–1.2)
Total Protein: 7.3 g/dL (ref 6.0–8.3)

## 2022-01-19 LAB — LIPID PANEL
Cholesterol: 183 mg/dL (ref 0–200)
HDL: 39.7 mg/dL (ref >39.00)
LDL Cholesterol: 110 mg/dL — ABNORMAL HIGH (ref 0–99)
NonHDL: 143.39
Total CHOL/HDL Ratio: 5
Triglycerides: 165 mg/dL — ABNORMAL HIGH (ref 0.0–149.0)
VLDL: 33 mg/dL (ref 0.0–40.0)

## 2022-01-19 MED ORDER — ALBUTEROL SULFATE HFA 108 (90 BASE) MCG/ACT IN AERS: 2.0000 | INHALATION_SPRAY | Freq: Four times a day (QID) | RESPIRATORY_TRACT | 0 refills | Status: DC | PRN

## 2022-01-19 MED ORDER — AMLODIPINE BESYLATE 2.5 MG PO TABS: 2.5000 mg | ORAL_TABLET | Freq: Every day | ORAL | 3 refills | Status: DC

## 2022-01-19 NOTE — Patient Instructions (Addendum)
-  shed prefer to come off medicine- if she is running <110/65 regularly can stop the hydrochlorothiazide and let me know   Please stop by lab before you go If you have mychart- we will send your results within 3 business days of [place] receiving them.  If you do not have mychart- we will call you about results within 5 business days of [place] receiving them.  *please also note that you will see labs on mychart as soon as they post. I will later go in and write notes on them- will say "notes from Dr. Yong Channel"   Recommended follow up: Return in about 6 months (around 07/21/2022) for followup or sooner if needed.Schedule b4 you leave.

## 2022-01-19 NOTE — Addendum Note (Signed)
Addended by: Marin Olp on: 01/19/2022 01:09 PM   Modules accepted: Orders

## 2022-01-19 NOTE — Progress Notes (Signed)
Phone [phone]   Subjective:  Patient presents today for their annual physical. Chief complaint-noted.   See problem oriented charting- ROS- full  review of systems was completed and negative per full ROS sheet completed by patient  The following were reviewed and entered/updated in epic: Past Medical History:  Diagnosis Date   Chest pain    Chicken pox    Chronic headaches    Hypertension    Shoulder pain    UTI (urinary tract infection)    Patient Active Problem List   Diagnosis Date Noted   Chronic headaches 05/26/2021    Priority: Medium    Essential hypertension 03/04/2021    Priority: Medium    Hyperlipidemia 02/10/2021    Priority: Medium    Dysmenorrhea 09/29/2019    Priority: Medium    Shoulder pain 05/26/2021    Priority: Low   Cervical radiculopathy at C6 03/28/2021    Priority: Low   Chicken pox 03/03/2021    Priority: Low   Dysplastic nevi 11/29/2012    Priority: Low   Atypical chest pain 10/11/2012    Priority: Low   Polyp of anterior nasal cavity 03/02/2012    Priority: Low   Atopic dermatitis 11/26/2009    Priority: [place], CERVICAL 04/19/2008    Priority: Low   PLANTAR FASCIITIS 04/19/2008    Priority: Low   Past Surgical History:  Procedure Laterality Date   CERVICAL DISC ARTHROPLASTY N/A 03/28/2021   Procedure: Artifical disc replacement, cervical five -six, cervical six-seven;  Surgeon: Karsten Ro, DO;  Location: [place];  Service: Neurosurgery;  Laterality: N/A;  3C   CESAREAN SECTION     2002,2007,2010   TUBAL LIGATION  2010    Family History  Problem Relation Age of Onset   Asthma Mother    Diabetes Mother    Healthy Father    Kidney Stones Brother    Kidney Stones Sister    Kidney Stones Brother    Other Maternal Grandmother        88 years old in 2021    Medications- reviewed and updated Current Outpatient Medications  Medication Sig Dispense Refill   albuterol (VENTOLIN HFA) 108 (90 Base) MCG/ACT  inhaler Inhale 2 puffs into the lungs every 6 (six) hours as needed for wheezing or shortness of breath. 1 each 0   Drospirenone 4 MG TABS Take 4 mg by mouth daily.     hydrochlorothiazide (MICROZIDE) 12.5 MG capsule Take 1 capsule (12.5 mg total) by mouth daily. 90 capsule 3   Pediatric Multivit-Minerals-C (MULTIVITAMIN CHILDRENS GUMMIES PO) Take 2 each by mouth daily. Unknown strength     SUMAtriptan (IMITREX) 50 MG tablet Take 1 tablet (50 mg total) by mouth every 2 (two) hours as needed for migraine. May repeat in 2 hours if headache persists or recurs. 10 tablet 3   amitriptyline (ELAVIL) 10 MG tablet Take 1 tablet (10 mg total) by mouth at bedtime. 90 tablet 4   amLODipine (NORVASC) 2.5 MG tablet Take 1 tablet (2.5 mg total) by mouth daily. 90 tablet 3   No current facility-administered medications for this visit.    Allergies-reviewed and updated No Known Allergies  Social History   Social History Narrative   Married. 3 daughters 12 (oldest married), 16, and 49 (involved with horses younger 2) in 2023.       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       Hobbies:crafters, reading, family time, music  Objective  Objective:  BP 110/80   Pulse 80   Temp 98.1 F (36.7 C)   Ht '5\' 8"'$  (1.727 m)   Wt 230 lb 6.4 oz (104.5 kg)   SpO2 98%   BMI 35.03 kg/m  Gen: NAD, resting comfortably HEENT: Mucous membranes are moist. Oropharynx normal Neck: no thyromegaly CV: RRR no murmurs rubs or gallops Lungs: CTAB no crackles, wheeze, rhonchi Abdomen: soft/nontender/nondistended/normal bowel sounds. No rebound or guarding.  Ext: no edema Skin: warm, dry Neuro: grossly normal, moves all extremities, PERRLA   Assessment and Plan   41 y.o. female presenting for annual physical.  Health Maintenance counseling: 1. Anticipatory guidance: Patient counseled regarding regular dental exams -q6 months, eye exams -yearly,  avoiding smoking and second hand smoke , limiting alcohol to 1  beverage per day- doesn't drink , no illicit drugs .   2. Risk factor reduction:  Advised patient of need for regular exercise and diet rich and fruits and vegetables to reduce risk of heart attack and stroke.  Exercise- active but no regular intentional exercise. Thinks she could walk or ride bikes  Diet/weight management-weight up from last cpe- gradual trend- a lot of stressors and attention dividing life events- feels could cut down on eating out would be a great step.  Wt Readings from Last 3 Encounters:  01/19/22 230 lb 6.4 oz (104.5 kg)  07/16/21 220 lb (99.8 kg)  06/17/21 226 lb 3.2 oz (102.6 kg)  3. Immunizations/screenings/ancillary studies- wants to hold off on covid shot Immunization History  Administered Date(s) Administered   Tdap 01/02/2021  4. Cervical cancer screening- sees Dr. Runell Gess and has visit next month 5. Breast cancer screening-  breast exam with GYN and mammogram - had first mammogram last year and reports good report- continue yearly 6. Colon cancer screening - no family history, start at age 1 7. Skin cancer screening- just started with derm. advised regular sunscreen use. Denies worrisome, changing, or new skin lesions.  8. Birth control/STD check- tubal ligation and monogamous plus on birth control for heavy periods/cyst that was bothersome per her report (more issues related to this) 9. Osteoporosis screening at 27- plan on this much later 10. Smoking associated screening - never smoker  Status of chronic or acute concerns   #social update - oldest just got married!   #Prior discectomy and fusion with [place] [facility] last year-patient reports continues to do well and prior chest pain resolved after surgery in August 2020   #hypertension S: medication: Amlodipine 2.5 mg at night, hydrochlorothiazide 12.5 mg in the morning Home readings #s: home cuff tends to run higher on the way she has been checking but we discussed some adjustments such as lifting  arm up and having in line in line with antecubital fossa BP Readings from Last 3 Encounters:  01/19/22 110/80  07/16/21 137/84  06/17/21 126/72  A/P: Controlled. Continue current medications.  -shed prefer to come off medicine- if she is running <110/65 regularly can stop the hydrochlorothiazide and let me know   #hyperlipidemia with history of atypical chest pain S: Medication:None The 10-year ASCVD risk score (Arnett DK, et al., 2019) is: 0.9%  -Thankfully CT coronary angiography did not show any significant obstructive disease on 03/10/2021 with CT calcium scoring of 0.38 and also had reassuring echocardiogram on 03/04/2021 Lab Results  Component Value Date   CHOL 184 10/27/2019   HDL 42.80 10/27/2019   LDLCALC 103 (H) 10/27/2019   TRIG 188.0 (H) 10/27/2019   CHOLHDL 4  10/27/2019   A/P: mildly elevated- wants to work on lifestyle. Very low ct cardiac scoring- would not start statin at this time unless cardiology recommended- as noted focus on lifestyle   #Dysmenorrhea-remains on drospirenone through GYN- sees next month- wants to discuss possible changes  #Chronic headaches S:She remains on amitriptyline 10 mg at bedtime  (only uses if poor sleep and headaches returning- with such low dose likely ok) with sumatriptan as needed (has not needed)-continues to follow with neurology Dr. April Manson A/P: doing much better- continue to monitor- essentially beter after neck surgery      Recommended follow up: No follow-ups on file. Future Appointments  Date Time Provider [place]  07/16/2022 11:15 AM Alric Ran, MD [facility] None   Lab/Order associations: fasting   ICD-10-CM   1. Preventative health care  Z00.00     2. Essential hypertension  I10     3. Mixed hyperlipidemia  E78.2      Meds ordered this encounter  Medications   amLODipine (NORVASC) 2.5 MG tablet    Sig: Take 1 tablet (2.5 mg total) by mouth daily.    Dispense:  90 tablet    Refill:  3   Return  precautions advised.  Garret Reddish, MD

## 2022-05-11 ENCOUNTER — Encounter: Payer: Self-pay | Admitting: *Deleted

## 2022-06-13 ENCOUNTER — Other Ambulatory Visit: Payer: Self-pay | Admitting: Family Medicine

## 2022-06-28 ENCOUNTER — Other Ambulatory Visit: Payer: Self-pay | Admitting: Neurology

## 2022-07-16 ENCOUNTER — Ambulatory Visit: Payer: No Typology Code available for payment source | Admitting: Neurology

## 2022-07-16 ENCOUNTER — Encounter: Payer: Self-pay | Admitting: Neurology

## 2022-07-16 VITALS — BP 130/87 | HR 90 | Ht 68.0 in | Wt 237.0 lb

## 2022-07-16 DIAGNOSIS — M542 Cervicalgia: Secondary | ICD-10-CM | POA: Diagnosis not present

## 2022-07-16 DIAGNOSIS — Z9889 Other specified postprocedural states: Secondary | ICD-10-CM | POA: Diagnosis not present

## 2022-07-16 MED ORDER — TIZANIDINE HCL 4 MG PO TABS: 4.0000 mg | ORAL_TABLET | Freq: Three times a day (TID) | ORAL | 0 refills | Status: DC | PRN

## 2022-07-16 NOTE — Patient Instructions (Addendum)
Discontinue amitriptyline Discontinue sumatriptan Trial of tizanidine as needed for the right side neck pain Continue with dry needling, consider massage therapy Follow-up in 1 year, at that time we will repeat CTA head.

## 2022-07-16 NOTE — Progress Notes (Signed)
[facility]  PATIENT: [name] DOB: 04-02-1981  REFERRING CLINICIAN: Marin Olp, MD HISTORY FROM: Patient  REASON FOR VISIT: Headaches/Neck pain   HISTORICAL  CHIEF COMPLAINT:  Chief Complaint  Patient presents with   Follow-up    Rm 15 Doing well reports 5 migraines a month ,Dry needling has also helped    INTERVAL HISTORY 07/16/2022 Patient presents today for follow-up, last visit was in November last year.  At that time we had recommended her to continue with Elavil nightly but she reports she has not been taking it the right way, she takes it sporadically, as needed basis.  Currently she reports her headaches are better, she is getting about 5 headaches per month.  She is doing dry needling which has helped a lot.  She has not been taking the sumatriptan.  She also reported that Aleve helps with the headaches.  She feels the headaches are from the right side of her neck and behind the right ear she does not feel any pain anywhere else. She has not tried massage therapy    INTERVAL HISTORY 07/16/2021: Patient presents today for follow up. Reports that headaches are better, Elavil works well for her but makes her sleepy. Report stop taking Elavil currently due to increase work demand and she wants to be productive, currently having 3 to 4 dull headaches per week. Will restart the Elavil next week but at a lower dose.  No other concerns or complaints.    HISTORY OF PRESENT ILLNESS:  This is a 41 year old woman with past medical history of hypertension, chronic headaches who is presenting for headaches, neck pain and left shoulder pain and numbness.  For her headaches patient reported she has been having headache for about 20 years, described the headaches are all over her head, pulsatile pain.  For headache she has tried ibuprofen with some minimal relief, reported that 1 her doctor previously had put her on a abortive medication possibly  sumatriptan but she has never been on a preventive medication.  She does have sensitivity to light with her headaches but no nausea no vomiting no photophobia.  She does not describe any auras.  Patient reports in the month of May-June she has been having worsening headache, in the back of the head with worsening neck pain and left shoulder pain and numbness of the left arm going down to her fingertips.  She had MRI and MRA of the head and also MRI of the neck which showed a large dissecting left paracentral foraminal disc protrusion with severe stenosis of the left aspect of the canal. The cord is deformed and displaced to the right.  She is status post cervical disc fusion about 2 weeks ago.  Reported since her surgery her headaches are still there but they are mild not sure it is related to the surgery itself or related to the pain medication that she is taking for the surgery.  She is also here to discuss her Brain MRI results.    Headache History and Characteristics: Onset: 20 years but worse since May Location: Diffuse Quality: Pulsating  Intensity: 7/10.  Migrainous Features: Photophobia, phonophobia, nausea, vomiting.  Aura: No  History of brain injury or tumor: No  Family history: 72 yo Daughter with headaches  Motion sickness: no Cardiac history: no  OTC: tylenol, codeine Caffeine: soda once a week  Sleep: Will get about 4 to 5 hours per night  Mood/ Stress: Stress is low, mood is fine  Prior prophylaxis: Propranolol: No  Verapamil:No TCA: No Topamax: No Depakote: No Effexor: No Cymbalta: No Neurontin:No  Prior abortives: Triptan: Possibly Anti-emetic: No Steroids: No Ergotamine suppository: No  Prior interventions: None   OTHER MEDICAL CONDITIONS: Chronic headaches, hypertension,    REVIEW OF SYSTEMS: Full 14 system review of systems performed and negative with exception of: as noted in the HPI  ALLERGIES: No Known Allergies  HOME MEDICATIONS: Outpatient  Medications Prior to Visit  Medication Sig Dispense Refill   albuterol (VENTOLIN HFA) 108 (90 Base) MCG/ACT inhaler Inhale 2 puffs into the lungs every 6 (six) hours as needed for wheezing or shortness of breath. 1 each 0   amLODipine (NORVASC) 2.5 MG tablet Take 1 tablet (2.5 mg total) by mouth daily. 90 tablet 3   Drospirenone 4 MG TABS Take 4 mg by mouth daily.     hydrochlorothiazide (MICROZIDE) 12.5 MG capsule TAKE 1 CAPSULE BY MOUTH EVERY DAY 90 capsule 3   Pediatric Multivit-Minerals-C (MULTIVITAMIN CHILDRENS GUMMIES PO) Take 2 each by mouth daily. Unknown strength     amitriptyline (ELAVIL) 10 MG tablet TAKE 1 TABLET AT BEDTIME 90 tablet 3   SUMAtriptan (IMITREX) 50 MG tablet Take 1 tablet (50 mg total) by mouth every 2 (two) hours as needed for migraine. May repeat in 2 hours if headache persists or recurs. 10 tablet 3   No facility-administered medications prior to visit.    PAST MEDICAL HISTORY: Past Medical History:  Diagnosis Date   Chest pain    Chicken pox    Chronic headaches    Hypertension    Shoulder pain    UTI (urinary tract infection)     PAST SURGICAL HISTORY: Past Surgical History:  Procedure Laterality Date   CERVICAL DISC ARTHROPLASTY N/A 03/28/2021   Procedure: Artifical disc replacement, cervical five -six, cervical six-seven;  Surgeon: Karsten Ro, DO;  Location: [facility];  Service: Neurosurgery;  Laterality: N/A;  3C   CESAREAN SECTION     2002,2007,2010   TUBAL LIGATION  2010    FAMILY HISTORY: Family History  Problem Relation Age of Onset   Asthma Mother    Diabetes Mother    Healthy Father    Kidney Stones Brother    Kidney Stones Sister    Kidney Stones Brother    Other Maternal Grandmother        42 years old in 2021    SOCIAL HISTORY: Social History   Socioeconomic History   Marital status: Married    Spouse name: Not on file   Number of children: 3   Years of education: 12   Highest education level: Not on file  Occupational  History   Not on file  Tobacco Use   Smoking status: Never   Smokeless tobacco: Never  Vaping Use   Vaping Use: Never used  Substance and Sexual Activity   Alcohol use: No   Drug use: No   Sexual activity: Yes  Other Topics Concern   Not on file  Social History Narrative   Married. 3 daughters 37 (oldest married), 25, and 23 (involved with horses younger 2) in 2023.       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       Hobbies:crafters, reading, family time, music   Social Determinants of Health   Financial Resource Strain: Not on file  Food Insecurity: Not on file  Transportation Needs: Not on file  Physical Activity: Not on file  Stress: Not on file  Social Connections: Not on file  Intimate Partner Violence: Not on file     PHYSICAL EXAM GENERAL EXAM/CONSTITUTIONAL: Vitals:  Vitals:   07/16/22 1113  BP: 130/87  Pulse: 90  Weight: 237 lb (107.5 kg)  Height: '5\' 8"'$  (1.727 m)    Body mass index is 36.04 kg/m. Wt Readings from Last 3 Encounters:  07/16/22 237 lb (107.5 kg)  01/19/22 230 lb 6.4 oz (104.5 kg)  07/16/21 220 lb (99.8 kg)   Patient is in no distress; well developed, nourished and groomed; neck in soft collar (had recent cervical spine surgery)  EYES: Pupils round and reactive to light, Visual fields full to confrontation, Extraocular movements intacts,   MUSCULOSKELETAL: Gait, strength, tone, movements noted in Neurologic exam below  NEUROLOGIC: MENTAL STATUS:  awake, alert, oriented to person, place and time recent and remote memory intact normal attention and concentration language fluent, comprehension intact, naming intact fund of knowledge appropriate  CRANIAL NERVE:  2nd, 3rd, 4th, 6th - pupils equal and reactive to light, visual fields full to confrontation, extraocular muscles intact, no nystagmus 5th - facial sensation symmetric 7th - facial strength symmetric 8th - hearing intact 9th - palate elevates symmetrically, uvula  midline 11th - shoulder shrug symmetric 12th - tongue protrusion midline  MOTOR:  normal bulk and tone, full strength. Tenderness to palpation right side of neck. There are right cervical muscle spasm  SENSORY:  normal and symmetric to light touch  COORDINATION:  finger-nose-finger, fine finger movements normal  REFLEXES:  deep tendon reflexes present and symmetric  GAIT/STATION:  normal   DIAGNOSTIC DATA (LABS, IMAGING, TESTING) - I reviewed patient records, labs, notes, testing and imaging myself where available.  Lab Results  Component Value Date   WBC 7.2 01/19/2022   HGB 14.3 01/19/2022   HCT 41.6 01/19/2022   MCV 88.9 01/19/2022   PLT 251.0 01/19/2022      Component Value Date/Time   NA 140 01/19/2022 1225   NA 139 03/04/2021 0939   K 3.6 01/19/2022 1225   CL 105 01/19/2022 1225   CO2 26 01/19/2022 1225   GLUCOSE 84 01/19/2022 1225   BUN 11 01/19/2022 1225   BUN 15 03/04/2021 0939   CREATININE 0.73 01/19/2022 1225   CALCIUM 9.2 01/19/2022 1225   PROT 7.3 01/19/2022 1225   ALBUMIN 4.2 01/19/2022 1225   AST 20 01/19/2022 1225   ALT 21 01/19/2022 1225   ALKPHOS 82 01/19/2022 1225   BILITOT 0.4 01/19/2022 1225   GFRNONAA >60 01/27/2021 1038   Lab Results  Component Value Date   CHOL 183 01/19/2022   HDL 39.70 01/19/2022   LDLCALC 110 (H) 01/19/2022   TRIG 165.0 (H) 01/19/2022   CHOLHDL 5 01/19/2022   No results found for: "HGBA1C" No results found for: "VITAMINB12" Lab Results  Component Value Date   TSH 3.22 01/02/2021    MRI 01/28/2021: 1. No evidence of acute intracranial abnormality. Specifically, no acute infarct. 2. Pericallosal lipoma. 3. 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 No adjacent brain edema or substantial mass effect. 4. Mild chronic microvascular ischemic disease.   MRA Head  01/28/2021: No large vessel occlusion or proximal hemodynamically significant stenosis. Small (1-2 mm) outpouching arising from the distal left M1 MCA appears to have a small vessel arising from its tip, compatible with an infundibulum. Small (1 mm) conical outpouching arising  from the dista left l basilar artery with a small artery rising from the tip, compatible infundibulum.   MRA Neck: No significant (greater than 50%) stenosis  MRI Cervical spine 02/14/2021:  1. At C5-C6, large inferiorly dissecting left paracentral/foraminal disc protrusion with severe stenosis of the left aspect of the canal and left C5-C6 foramen. The cord is deformed and displaced to the right. Moderate right foraminal stenosis. 2. At C6-C7, broad disc bulge which contacts and flattens the cord with mild to moderate canal stenosis and moderate right and mild left foraminal stenosis. 3. At C4-C5, moderate left foraminal stenosis.   ASSESSMENT AND PLAN  41 y.o. year old female with past medical history of chronic migraines, hypertension and recent cervical spine discectomy and fusion for a severe cervical stenosis presents today for follow-up.  Patient stated that headaches improved with dry needling.  Currently she is reporting 5 headaches per month.  At this time, I will discontinue the Elavil since she has been taking it sporadically.  I will continue to recommend dry needling, massage therapy and also advised her to take Aleve and muscle relaxant as needed. Will prescribed Tizanidine. In terms of the infundibulum seen on MRA, plan will be to repeat the CTA head next year.  She voices understanding.  Advised her to contact me sooner if her symptoms get worse.   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  I explained to the patient that these are different from aneurysm but we can monitor them with repeat CTA  discontinue amitriptyline in the future.    1. Cervicalgia   2. S/P cervical discectomy     Patient Instructions  Discontinue amitriptyline Discontinue sumatriptan Trial of tizanidine as needed for the right side neck pain Continue with dry needling, consider massage therapy Follow-up in 1 year, at that time we will repeat CTA head.    ----------------------------------------------------------------------------------------------------------------------------------------------- "An infundibulum (plural: infundibula) is a conical outpouching from an artery (usually intracranial), with a broad base narrowing to an apex from which a vessel originates. The most common location for an infundibulum is the origin of the posterior communicating artery (PCOM) from the supraclinoid internal carotid artery. They are common, found in up to a quarter of all cerebral angiograms.   The main importance of an infundibulum is that it may be mistaken for a saccular (berry) aneurysm (which is rounded and has the branch at its base). An infundibulum in most cases measures less than 3 mm. Unlike an aneurysm, an infundibulum is not believed to be a risk for rupture and subarachnoid hemorrhage. Only very rarely does an infundibulum eventually develop into an aneurysm and it is generally not thought that an incidental infundibulum requires follow-up unless additional clinical concern is present. Prudent factors that may indicate follow-up include large size, family history of subarachnoid hemorrhage or aneurysm, connective tissue disorders or history of dissection, or an aneurysm elsewhere"   Reference: Einar Grad, Ward Odette Fraction [facility], [place] RS. The progression of an infundibulum to aneurysm formation and rupture: case report and literature review. Neurosurgery. 43 (6): 1445-8; discussion 1448-9  No orders of the defined types were placed in this encounter.    Meds ordered this encounter  Medications    tiZANidine (ZANAFLEX) 4 MG tablet    Sig: Take 1 tablet (4 mg total) by mouth every 8 (eight) hours as needed for muscle spasms.  Dispense:  30 tablet    Refill:  0     Return in about 1 year (around 07/17/2023).  I have spent a total of 30 minutes dedicated to this patient today, preparing to see patient, performing a medically appropriate examination and evaluation, ordering tests and/or medications and procedures, and counseling and educating the patient/family/caregiver; independently interpreting result and communicating results to the family/patient/caregiver; and documenting clinical information in the electronic medical record.   Alric Ran, MD 07/16/2022, 11:40 AM  [facility] [address], [place], [place] [address] [phone]

## 2022-07-21 ENCOUNTER — Ambulatory Visit: Payer: No Typology Code available for payment source | Admitting: Family Medicine

## 2022-07-30 ENCOUNTER — Encounter: Payer: Self-pay | Admitting: *Deleted

## 2022-08-24 ENCOUNTER — Ambulatory Visit: Payer: No Typology Code available for payment source | Admitting: Family Medicine

## 2022-08-24 ENCOUNTER — Encounter: Payer: Self-pay | Admitting: Family Medicine

## 2022-08-24 VITALS — BP 132/86 | HR 95 | Temp 98.2°F | Ht 68.0 in | Wt 235.8 lb

## 2022-08-24 DIAGNOSIS — E782 Mixed hyperlipidemia: Secondary | ICD-10-CM | POA: Diagnosis not present

## 2022-08-24 DIAGNOSIS — I10 Essential (primary) hypertension: Secondary | ICD-10-CM | POA: Diagnosis not present

## 2022-08-24 MED ORDER — ALBUTEROL SULFATE HFA 108 (90 BASE) MCG/ACT IN AERS: 2.0000 | INHALATION_SPRAY | Freq: Four times a day (QID) | RESPIRATORY_TRACT | 0 refills | Status: DC | PRN

## 2022-08-24 NOTE — Progress Notes (Signed)
Phone [phone] In person visit   Subjective:   CARLINDA OHLSON is a 42 y.o. year old very pleasant female patient who presents for/with See problem oriented charting Chief Complaint  Patient presents with   Follow-up   Hypertension    Past Medical History-  Patient Active Problem List   Diagnosis Date Noted   Chronic headaches 05/26/2021    Priority: Medium    Essential hypertension 03/04/2021    Priority: Medium    Hyperlipidemia 02/10/2021    Priority: Medium    Dysmenorrhea 09/29/2019    Priority: Medium    Chicken pox 03/03/2021    Priority: Low   Dysplastic nevi 11/29/2012    Priority: Low   Atypical chest pain 10/11/2012    Priority: Low   Polyp of anterior nasal cavity 03/02/2012    Priority: Low   Atopic dermatitis 11/26/2009    Priority: Low   PLANTAR FASCIITIS 04/19/2008    Priority: Low   Shoulder pain 05/26/2021    Priority: 1.   Cervical radiculopathy at C6 03/28/2021    Priority: 1.   [place] DISEASE, CERVICAL 04/19/2008    Priority: 1.    Medications- reviewed and updated Current Outpatient Medications  Medication Sig Dispense Refill   amLODipine (NORVASC) 2.5 MG tablet Take 1 tablet (2.5 mg total) by mouth daily. 90 tablet 3   Drospirenone 4 MG TABS Take 4 mg by mouth daily.     hydrochlorothiazide (MICROZIDE) 12.5 MG capsule TAKE 1 CAPSULE BY MOUTH EVERY DAY 90 capsule 3   Pediatric Multivit-Minerals-C (MULTIVITAMIN CHILDRENS GUMMIES PO) Take 2 each by mouth daily. Unknown strength     tiZANidine (ZANAFLEX) 4 MG tablet Take 1 tablet (4 mg total) by mouth every 8 (eight) hours as needed for muscle spasms. 30 tablet 0   albuterol (VENTOLIN HFA) 108 (90 Base) MCG/ACT inhaler Inhale 2 puffs into the lungs every 6 (six) hours as needed for wheezing or shortness of breath. 1 each 0   No current facility-administered medications for this visit.     Objective:  BP 132/86   Pulse 95   Temp 98.2 F (36.8 C)   Ht '5\' 8"'$  (1.727 m)   Wt 235 lb 12.8  oz (107 kg)   SpO2 97%   BMI 35.85 kg/m  Gen: NAD, resting comfortably     Assessment and Plan   #hypertension S: medication: Amlodipine 2.5 mg at night, hydrochlorothiazide 12.5 mg every other day (off day today) Home readings #s: similar readings whether on hydrochlorothiazide day or not- high 130s/70s or low 80s - no regular exercise (is active though) BP Readings from Last 3 Encounters:  08/24/22 132/86  07/16/22 130/87  01/19/22 110/80  A/P: blood pressure above goal 135/85 both here and at home- we opted to increase hydrochlorothiazide back to daily and continue amlodipine daily. Goal still is to reduce medicine when we can- likely will take regular exercise, healthy eating, and weigh tloss combo.  - recommended 6 month physical to recheck with goal at least 5-10 lbs down  #hyperlipidemia S: Medication: None -Thankfully CT coronary angiography did not show any significant obstructive disease on 03/10/2021 with CT calcium score of 0.38 and reassuring echocardiogram July 2022 -She strongly prefers to work on lifestyle over medicine -she know sshe needs to lose weight but has been really busy season for her and hard for her- up 5 lbs from june Lab Results  Component Value Date   CHOL 183 01/19/2022   HDL 39.70 01/19/2022  LDLCALC 110 (H) 01/19/2022   TRIG 165.0 (H) 01/19/2022   CHOLHDL 5 01/19/2022  A/P: lipids above ideal goal- is going to talk to cardiology in 11 days. With coronary artery calcium at her age could strongly consider statin but wants to work on lifestyle first.   # Dysmenorrhea-remains on birth control likely until menopause through Dr. Runell Gess  # Cough with laughing periods- starts to wheeze and wants to try albuterol- reasonable to use to get more data. Could refer to pulmonology if profound response.    #Prior discectomy and fusion with [place] [facility] with Dr. Reatha Armour in past- patient reports  prior chest pain resolved after surgery in August 2020 -  later had recurrent issues in right shoulder more recently(prior left shoulder issues) and tried dry needling with some improvement -went back to neurology who recommended trial tizanidine and has been very heplful as needed. Also helped with headaches   Recommended follow up: Return in about 6 months (around 02/22/2023) for physical or sooner if needed.Schedule b4 you leave. Future Appointments  Date Time Provider [place]  09/04/2022 12:40 PM Park Liter, MD [facility] None  07/29/2023 10:15 AM Alric Ran, MD [facility] None   Lab/Order associations:   ICD-10-CM   1. Essential hypertension  I10     2. Mixed hyperlipidemia  E78.2       Meds ordered this encounter  Medications   albuterol (VENTOLIN HFA) 108 (90 Base) MCG/ACT inhaler    Sig: Inhale 2 puffs into the lungs every 6 (six) hours as needed for wheezing or shortness of breath.    Dispense:  1 each    Refill:  0    Return precautions advised.  Garret Reddish, MD

## 2022-08-24 NOTE — Patient Instructions (Addendum)
blood pressure above goal 135/85 both here and at home- we opted to increase hydrochlorothiazide back to daily and continue amlodipine daily. Goal still is to reduce medicine when we can- likely will take regular exercise, healthy eating, and weigh tloss combo.  - recommended 6 month physical to recheck with goal at least 5-10 lbs down  Atomic Habits by Wilma Flavin The ruthless elimination of Hurry by Vernia Buff Comer  January 15th to February 15th Walking 2 days a week for 30 minutes I want you to use a calendar to mark your successes - check marks on the dates you complete it. Ok to go over but strive not to go under goal.   Trial myfitnesspal for solid 2 months- log every calorie. The habit of logging is even more important than the weight loss. Goal should be half pound a week or pound a week weight loss. I wnt it to feel sustainable.   Update me with how you are doing.   Recommended follow up: Return in about 6 months (around 02/22/2023) for physical or sooner if needed.Schedule b4 you leave.

## 2022-09-04 ENCOUNTER — Encounter: Payer: Self-pay | Admitting: Cardiology

## 2022-09-04 ENCOUNTER — Ambulatory Visit: Payer: No Typology Code available for payment source | Attending: Cardiology | Admitting: Cardiology

## 2022-09-04 VITALS — BP 120/84 | HR 76 | Ht 63.0 in | Wt 230.0 lb

## 2022-09-04 DIAGNOSIS — I10 Essential (primary) hypertension: Secondary | ICD-10-CM | POA: Diagnosis not present

## 2022-09-04 DIAGNOSIS — E782 Mixed hyperlipidemia: Secondary | ICD-10-CM | POA: Diagnosis not present

## 2022-09-04 DIAGNOSIS — I2510 Atherosclerotic heart disease of native coronary artery without angina pectoris: Secondary | ICD-10-CM

## 2022-09-04 DIAGNOSIS — M5412 Radiculopathy, cervical region: Secondary | ICD-10-CM

## 2022-09-04 MED ORDER — ASPIRIN 81 MG PO TBEC: 81.0000 mg | DELAYED_RELEASE_TABLET | Freq: Every day | ORAL | 3 refills | Status: AC

## 2022-09-04 NOTE — Patient Instructions (Addendum)
Medication Instructions:  Your physician has recommended you make the following change in your medication: Start baby Aspirin '81mg'$  daily. *If you need a refill on your cardiac medications before your next appointment, please call your pharmacy*   Lab Work: None If you have labs (blood work) drawn today and your tests are completely normal, you will receive your results only by: [place] (if you have MyChart) OR A paper copy in the mail If you have any lab test that is abnormal or we need to change your treatment, we will call you to review the results.   Testing/Procedures: None   Follow-Up: At [facility], you and your health needs are our priority.  As part of our continuing mission to provide you with exceptional heart care, we have created designated Provider Care Teams.  These Care Teams include your primary Cardiologist (physician) and Advanced Practice Providers (APPs -  Physician Assistants and Nurse Practitioners) who all work together to provide you with the care you need, when you need it.  We recommend signing up for the patient portal called "MyChart".  Sign up information is provided on this After Visit Summary.  MyChart is used to connect with patients for Virtual Visits (Telemedicine).  Patients are able to view lab/test results, encounter notes, upcoming appointments, etc.  Non-urgent messages can be sent to your provider as well.   To learn more about what you can do with MyChart, go to [email or web address].    Your next appointment:   1 year(s)  Provider:   Jenne Campus, MD    Other Instructions

## 2022-09-04 NOTE — Progress Notes (Signed)
Cardiology Office Note:    Date:  09/04/2022   ID:  [name], DOB [birth year], MRN [id number]  PCP:  Marin Olp, MD  Cardiologist:  Jenne Campus, MD    Referring MD: Marin Olp, MD   Chief Complaint  Patient presents with   Follow-up  Doing fine  History of Present Illness:    [name] is a 42 y.o. female with past medical history significant for essential hypertension, dyslipidemia, obesity, 2 years ago she got a coronary CT angio because of atypical chest pain.  Coronary CT angio showed 0 to 25% LAD nonobstructive calcified lesion with very minimally elevated calcium score.  She still complaining from some chest pain but not related to exercise.  She also got cervical radiculopathy I suspect pain is related to that.  Recently she had a visit with her primary care physician there was discussion about risk factors modifications she was encouraged to exercise which I strongly support.  Unconvinced if she lose some weight she will feel better and she will have much less symptoms.  We did discuss basic of diet and basic of exercises I told her that official recommendation 5 times a week for 30 minutes exercise however she gradually went to get into exercise which is probably more appropriate.  Past Medical History:  Diagnosis Date   Chest pain    Chicken pox    Chronic headaches    Hypertension    Shoulder pain    UTI (urinary tract infection)     Past Surgical History:  Procedure Laterality Date   CERVICAL DISC ARTHROPLASTY N/A 03/28/2021   Procedure: Artifical disc replacement, cervical five -six, cervical six-seven;  Surgeon: Karsten Ro, DO;  Location: [place];  Service: Neurosurgery;  Laterality: N/A;  3C   CESAREAN SECTION     2002,2007,2010   TUBAL LIGATION  2010    Current Medications: Current Meds  Medication Sig   albuterol (VENTOLIN HFA) 108 (90 Base) MCG/ACT inhaler Inhale 2 puffs into the lungs every 6 (six) hours as needed for  wheezing or shortness of breath.   amLODipine (NORVASC) 2.5 MG tablet Take 1 tablet (2.5 mg total) by mouth daily.   Drospirenone 4 MG TABS Take 4 mg by mouth daily.   hydrochlorothiazide (MICROZIDE) 12.5 MG capsule TAKE 1 CAPSULE BY MOUTH EVERY DAY (Patient taking differently: Take 12.5 mg by mouth daily.)   Pediatric Multivit-Minerals-C (MULTIVITAMIN CHILDRENS GUMMIES PO) Take 2 each by mouth daily. Unknown strength   tiZANidine (ZANAFLEX) 4 MG tablet Take 1 tablet (4 mg total) by mouth every 8 (eight) hours as needed for muscle spasms.     Allergies:   Patient has no known allergies.   Social History   Socioeconomic History   Marital status: Married    Spouse name: Not on file   Number of children: 3   Years of education: 12   Highest education level: Not on file  Occupational History   Not on file  Tobacco Use   Smoking status: Never   Smokeless tobacco: Never  Vaping Use   Vaping Use: Never used  Substance and Sexual Activity   Alcohol use: No   Drug use: No   Sexual activity: Yes  Other Topics Concern   Not on file  Social History Narrative   Married. 3 daughters 70 (oldest married), 17, and 25 (involved with horses younger 2) in 2023.       Medical illustrator- owns own business   HS  grad.       Hobbies:crafters, reading, family time, music   Social Determinants of Health   Financial Resource Strain: Not on file  Food Insecurity: Not on file  Transportation Needs: Not on file  Physical Activity: Not on file  Stress: Not on file  Social Connections: Not on file     Family History: The patient's family history includes Asthma in her mother; Diabetes in her mother; Healthy in her father; Kidney Stones in her brother, brother, and sister; Other in her maternal grandmother. ROS:   Please see the history of present illness.    All 14 point review of systems negative except as described per history of present illness  EKGs/Labs/Other Studies Reviewed:       Recent Labs: 01/19/2022: ALT 21; BUN 11; Creatinine, Ser 0.73; Hemoglobin 14.3; Platelets 251.0; Potassium 3.6; Sodium 140  Recent Lipid Panel    Component Value Date/Time   CHOL 183 01/19/2022 1225   TRIG 165.0 (H) 01/19/2022 1225   HDL 39.70 01/19/2022 1225   CHOLHDL 5 01/19/2022 1225   VLDL 33.0 01/19/2022 1225   LDLCALC 110 (H) 01/19/2022 1225    Physical Exam:    VS:  BP 120/84 (BP Location: Left Arm, Patient Position: Sitting)   Pulse 76   Ht '5\' 3"'$  (1.6 m)   Wt 230 lb (104.3 kg)   SpO2 96%   BMI 40.74 kg/m     Wt Readings from Last 3 Encounters:  09/04/22 230 lb (104.3 kg)  08/24/22 235 lb 12.8 oz (107 kg)  07/16/22 237 lb (107.5 kg)     GEN:  Well nourished, well developed in no acute distress HEENT: Normal NECK: No JVD; No carotid bruits LYMPHATICS: No lymphadenopathy CARDIAC: RRR, no murmurs, no rubs, no gallops RESPIRATORY:  Clear to auscultation without rales, wheezing or rhonchi  ABDOMEN: Soft, non-tender, non-distended MUSCULOSKELETAL:  No edema; No deformity  SKIN: Warm and dry LOWER EXTREMITIES: no swelling NEUROLOGIC:  Alert and oriented x 3 PSYCHIATRIC:  Normal affect   ASSESSMENT:    1. Coronary artery disease involving native coronary artery of native heart without angina pectoris   2. Cervical radiculopathy at C6   3. Essential hypertension   4. Mixed hyperlipidemia    PLAN:    In order of problems listed above:  Coronary disease minimal.  I will ask her to start taking 1 baby aspirin every single day, in terms of treatment with statin she want to try with exercise diet which is fine primary care physician follow-up for that. Dyslipidemia plan as described above I did review K PN which show me LDL 110 HDL 39.7 we will continue present management. Essential hypertension blood pressure well-controlled.   Medication Adjustments/Labs and Tests Ordered: Current medicines are reviewed at length with the patient today.  Concerns regarding  medicines are outlined above.  No orders of the defined types were placed in this encounter.  Medication changes: No orders of the defined types were placed in this encounter.   Signed, Park Liter, MD, [facility] 09/04/2022 1:19 PM    [place]

## 2022-10-05 ENCOUNTER — Encounter: Payer: Self-pay | Admitting: Family Medicine

## 2023-02-22 ENCOUNTER — Other Ambulatory Visit: Payer: Self-pay | Admitting: Family Medicine

## 2023-03-03 ENCOUNTER — Ambulatory Visit: Payer: No Typology Code available for payment source | Admitting: Family Medicine

## 2023-03-03 ENCOUNTER — Encounter: Payer: Self-pay | Admitting: Family Medicine

## 2023-03-03 VITALS — BP 120/84 | HR 91 | Temp 98.1°F | Ht 63.0 in | Wt 237.4 lb

## 2023-03-03 DIAGNOSIS — E782 Mixed hyperlipidemia: Secondary | ICD-10-CM

## 2023-03-03 DIAGNOSIS — Z0000 Encounter for general adult medical examination without abnormal findings: Secondary | ICD-10-CM

## 2023-03-03 DIAGNOSIS — Z131 Encounter for screening for diabetes mellitus: Secondary | ICD-10-CM

## 2023-03-03 DIAGNOSIS — Z6841 Body Mass Index (BMI) 40.0 and over, adult: Secondary | ICD-10-CM

## 2023-03-03 DIAGNOSIS — I10 Essential (primary) hypertension: Secondary | ICD-10-CM

## 2023-03-03 LAB — LIPID PANEL
Cholesterol: 194 mg/dL (ref 0–200)
HDL: 36.8 mg/dL — ABNORMAL LOW (ref >39.00)
NonHDL: 157.58
Total CHOL/HDL Ratio: 5
Triglycerides: 201 mg/dL — ABNORMAL HIGH (ref 0.0–149.0)
VLDL: 40.2 mg/dL — ABNORMAL HIGH (ref 0.0–40.0)

## 2023-03-03 LAB — CBC WITH DIFFERENTIAL/PLATELET
Basophils Absolute: 0.1 10*3/uL (ref 0.0–0.1)
Basophils Relative: 1.1 % (ref 0.0–3.0)
Eosinophils Absolute: 0.1 10*3/uL (ref 0.0–0.7)
Eosinophils Relative: 0.9 % (ref 0.0–5.0)
HCT: 42.1 % (ref 36.0–46.0)
Hemoglobin: 14.3 g/dL (ref 12.0–15.0)
Lymphocytes Relative: 28.7 % (ref 12.0–46.0)
Lymphs Abs: 2.4 10*3/uL (ref 0.7–4.0)
MCHC: 33.9 g/dL (ref 30.0–36.0)
MCV: 89.3 fl (ref 78.0–100.0)
Monocytes Absolute: 0.5 10*3/uL (ref 0.1–1.0)
Monocytes Relative: 5.9 % (ref 3.0–12.0)
Neutro Abs: 5.3 10*3/uL (ref 1.4–7.7)
Neutrophils Relative %: 63.4 % (ref 43.0–77.0)
Platelets: 255 10*3/uL (ref 150.0–400.0)
RBC: 4.71 Mil/uL (ref 3.87–5.11)
RDW: 13.6 % (ref 11.5–15.5)
WBC: 8.3 10*3/uL (ref 4.0–10.5)

## 2023-03-03 LAB — COMPREHENSIVE METABOLIC PANEL
ALT: 29 U/L (ref 0–35)
AST: 27 U/L (ref 0–37)
Albumin: 4.3 g/dL (ref 3.5–5.2)
Alkaline Phosphatase: 104 U/L (ref 39–117)
BUN: 9 mg/dL (ref 6–23)
CO2: 24 mEq/L (ref 19–32)
Calcium: 9.7 mg/dL (ref 8.4–10.5)
Chloride: 102 mEq/L (ref 96–112)
Creatinine, Ser: 0.73 mg/dL (ref 0.40–1.20)
GFR: 100.32 mL/min (ref >60.00)
Glucose, Bld: 101 mg/dL — ABNORMAL HIGH (ref 70–99)
Potassium: 3.5 mEq/L (ref 3.5–5.1)
Sodium: 138 mEq/L (ref 135–145)
Total Bilirubin: 0.5 mg/dL (ref 0.2–1.2)
Total Protein: 7.7 g/dL (ref 6.0–8.3)

## 2023-03-03 LAB — TSH: TSH: 4.21 u[IU]/mL (ref 0.35–5.50)

## 2023-03-03 LAB — LDL CHOLESTEROL, DIRECT: Direct LDL: 136 mg/dL

## 2023-03-03 LAB — HEMOGLOBIN A1C: Hgb A1c MFr Bld: 6.1 % (ref 4.6–6.5)

## 2023-03-03 NOTE — Patient Instructions (Addendum)
Please stop by lab before you go If you have mychart- we will send your results within 3 business days of [place] receiving them.  If you do not have mychart- we will call you about results within 5 business days of [place] receiving them.  *please also note that you will see labs on mychart as soon as they post. I will later go in and write notes on them- will say "notes from Dr. Durene Cal"   Goal long term 150 minutes exercise a week- even if we can get you to 15-20 minutes 3x a week that would be an excellent step   Consider Going back to consistent calorie counting LONG term- even committing to 6-12 months and measuring foods if having issues with weight loss Trial intermittent fasting- eating only for 8-10 hour window in the day  Recommended follow up: Return in about 6 months (around 09/03/2023) for followup or sooner if needed.Schedule b4 you leave.

## 2023-03-03 NOTE — Progress Notes (Signed)
Phone [phone]   Subjective:  Patient presents today for their annual physical. Chief complaint-noted.   See problem oriented charting- ROS- full  review of systems was completed and negative Per full ROS sheet completed by patient- fatigue   The following were reviewed and entered/updated in epic: Past Medical History:  Diagnosis Date   Chest pain    Chicken pox    Chronic headaches    Hypertension    Shoulder pain    UTI (urinary tract infection)    Patient Active Problem List   Diagnosis Date Noted   Chronic headaches 05/26/2021    Priority: Medium    Essential hypertension 03/04/2021    Priority: Medium    Hyperlipidemia 02/10/2021    Priority: Medium    Dysmenorrhea 09/29/2019    Priority: Medium    Chicken pox 03/03/2021    Priority: Low   Dysplastic nevi 11/29/2012    Priority: Low   Atypical chest pain 10/11/2012    Priority: Low   Polyp of anterior nasal cavity 03/02/2012    Priority: Low   Atopic dermatitis 11/26/2009    Priority: Low   PLANTAR FASCIITIS 04/19/2008    Priority: Low   Shoulder pain 05/26/2021    Priority: 1.   Cervical radiculopathy at C6 03/28/2021    Priority: 1.   DISC DISEASE, CERVICAL 04/19/2008    Priority: 1.   Coronary disease: Minimal, nonobstructive based on coronary CT angio from 2023 09/04/2022   Past Surgical History:  Procedure Laterality Date   CERVICAL DISC ARTHROPLASTY N/A 03/28/2021   Procedure: Artifical disc replacement, cervical five -six, cervical six-seven;  Surgeon: Bethann Goo, DO;  Location[facility];  Service: Neurosurgery;  Laterality: N/A;  3C   CESAREAN SECTION     2002,2007,2010   TUBAL LIGATION  2010    Family History  Problem Relation Age of Onset   Asthma Mother    Diabetes Mother    Healthy Father    Kidney Stones Brother    Kidney Stones Sister    Kidney Stones Brother    Other Maternal Grandmother        54 years old in 2021    Medications- reviewed and updated Current  Outpatient Medications  Medication Sig Dispense Refill   albuterol (VENTOLIN HFA) 108 (90 Base) MCG/ACT inhaler Inhale 2 puffs into the lungs every 6 (six) hours as needed for wheezing or shortness of breath. 1 each 0   amLODipine (NORVASC) 2.5 MG tablet TAKE 1 TABLET DAILY 90 tablet 3   aspirin EC 81 MG tablet Take 1 tablet (81 mg total) by mouth daily. Swallow whole. 90 tablet 3   Drospirenone 4 MG TABS Take 4 mg by mouth daily.     hydrochlorothiazide (MICROZIDE) 12.5 MG capsule TAKE 1 CAPSULE BY MOUTH EVERY DAY (Patient taking differently: Take 12.5 mg by mouth daily.) 90 capsule 3   Pediatric Multivit-Minerals-C (MULTIVITAMIN CHILDRENS GUMMIES PO) Take 2 each by mouth daily. Unknown strength     No current facility-administered medications for this visit.    Allergies-reviewed and updated No Known Allergies  Social History   Social History Narrative   Married. 3 daughters 37 (oldest married), 45, and 40 (involved with horses younger 2) in 2023.       Advertising account planner- owns own business   HS grad.       Hobbies:crafters, reading, family time, music   Objective  Objective:  BP 120/84   Pulse 91   Temp 98.1 F (36.7 C)  Ht 5\' 3"  (1.6 m)   Wt 237 lb 6.4 oz (107.7 kg)   SpO2 98%   BMI 42.05 kg/m  Gen: NAD, resting comfortably HEENT: Mucous membranes are moist. Oropharynx normal Neck: no thyromegaly CV: RRR no murmurs rubs or gallops Lungs: CTAB no crackles, wheeze, rhonchi Abdomen: soft/nontender/nondistended/normal bowel sounds. No rebound or guarding.  Ext: no edema Skin: warm, dry Neuro: grossly normal, moves all extremities, PERRLA   Assessment and Plan   42 y.o. female presenting for annual physical.  Health Maintenance counseling: 1. Anticipatory guidance: Patient counseled regarding regular dental exams -q6 months, eye exams - yearly,  avoiding smoking and second hand smoke , limiting alcohol to 1 beverage per day- doesn't drink , no illicit drugs .   2. Risk  factor reduction:  Advised patient of need for regular exercise and diet rich and fruits and vegetables to reduce risk of heart attack and stroke.  Exercise- active with helping family with riding horses but no intentional regular exercise- some swimming. Doesn't enjoy walking.  Diet/weight management-weight up 7 pounds in the last year and 2 pounds from last visit- feels has hard time losing weight. TSH normal 2 years ago. Tried myfitnesspal for 2 months- was down 5 lbs.  Wt Readings from Last 3 Encounters:  03/03/23 237 lb 6.4 oz (107.7 kg)  09/04/22 230 lb (104.3 kg)  08/24/22 235 lb 12.8 oz (107 kg)  3. Immunizations/screenings/ancillary studies-declines COVID shot long-term and typically defers 2 shots in the fall-otherwise up-to-date  Immunization History  Administered Date(s) Administered   Tdap 01/02/2021  4. Cervical cancer screening- sees Dr. Adalberto Ill and sees next week- we have pap on file from june 2022 5. Breast cancer screening-  breast exam with GYN and mammogram -yearly- planned at visit  6. Colon cancer screening - no family history, start at age 57  7. Skin cancer screening- just started with derm last year- sees in September again . advised regular sunscreen use. Denies worrisome, changing, or new skin lesions.  8. Birth control/STD check- tubal ligation and monogamous plus on birth control for heavy periods/cyst that was bothersome per her report (more issues related to this) 9. Osteoporosis screening at 37- plan on this much later 10. Smoking associated screening - never smoker  Status of chronic or acute concerns   #hypertension S: medication: Amlodipine 2.5 mg, hydrochlorothiazide 12.5 mg A/P: stable- continue current medicines   #hyperlipidemia S: Medication:None -Thankfully CT coronary angiography did not show any significant obstructive disease on 03/10/2021 with CT calcium score of 0.38 and reassuring echocardiogram July 2022 -She strongly prefers to work on  lifestyle Lab Results  Component Value Date   CHOL 183 01/19/2022   HDL 39.70 01/19/2022   LDLCALC 110 (H) 01/19/2022   TRIG 165.0 (H) 01/19/2022   CHOLHDL 5 01/19/2022  A/P: lipids are mildly elevated but wants to work on lifestyle- in long run we really need to get LDL closer to 70 to reduce long term CV risk   # Dysmenorrhea-remains on birth control likely until menopause through Dr. Adalberto Ill   # Chronic headaches-amitriptyline at bedtime if poor sleep (has been off and may restart) and headaches returning with sumatriptan as needed-has seen neurology Dr. Teresa Coombs- thinks muscle tension may havec ontributed - muscle relaxant helps   #Prior discectomy and fusion with [place] [facility] with Dr. Jake Samples last year-patient reports continues to do well and prior chest pain resolved after surgery in August 2020- no recurrence issues -some shoulder issues    #snoring  and daytime sleepiness- offered sleep apnea testing- declines for now- wants to focus on weight loss  Recommended follow up: Return in about 6 months (around 09/03/2023) for followup or sooner if needed.Schedule b4 you leave. Future Appointments  Date Time Provider Department Center  07/29/2023 10:15 AM Windell Norfolk, MD [facility] None    Lab/Order associations: fasting   ICD-10-CM   1. Preventative health care  Z00.00 Comprehensive metabolic panel    CBC with Differential/Platelet    Lipid panel    HgB A1c    TSH    2. Essential hypertension  I10     3. Mixed hyperlipidemia  E78.2 Comprehensive metabolic panel    CBC with Differential/Platelet    Lipid panel    TSH    4. Screening for diabetes mellitus  Z13.1 HgB A1c    5. Morbid obesity (HCC)  E66.01 HgB A1c      No orders of the defined types were placed in this encounter.   Return precautions advised.  Tana Conch, MD

## 2023-03-04 ENCOUNTER — Encounter: Payer: Self-pay | Admitting: Family Medicine

## 2023-03-04 DIAGNOSIS — R7303 Prediabetes: Secondary | ICD-10-CM | POA: Insufficient documentation

## 2023-06-16 ENCOUNTER — Other Ambulatory Visit: Payer: Self-pay | Admitting: Neurology

## 2023-06-25 ENCOUNTER — Other Ambulatory Visit: Payer: Self-pay | Admitting: Family Medicine

## 2023-07-29 ENCOUNTER — Ambulatory Visit: Payer: No Typology Code available for payment source | Admitting: Neurology

## 2023-07-29 ENCOUNTER — Encounter: Payer: Self-pay | Admitting: Neurology

## 2023-07-29 VITALS — BP 121/91 | HR 82 | Ht 68.0 in | Wt 236.5 lb

## 2023-07-29 DIAGNOSIS — M542 Cervicalgia: Secondary | ICD-10-CM

## 2023-07-29 DIAGNOSIS — Z9889 Other specified postprocedural states: Secondary | ICD-10-CM | POA: Diagnosis not present

## 2023-07-29 DIAGNOSIS — I999 Unspecified disorder of circulatory system: Secondary | ICD-10-CM | POA: Diagnosis not present

## 2023-07-29 MED ORDER — TIZANIDINE HCL 4 MG PO TABS: 4.0000 mg | ORAL_TABLET | Freq: Four times a day (QID) | ORAL | 0 refills | Status: AC | PRN

## 2023-07-29 NOTE — Patient Instructions (Addendum)
Continue current medications, gave patient 10 tablets of tizanidine as needed for cervicalgia Repeat CT angiogram to follow-up infundibulum versus aneurysm  Continue to follow with PCP and return as needed.

## 2023-07-29 NOTE — Progress Notes (Signed)
[facility]  PATIENT: [name] DOB: [birth year]  REFERRING CLINICIAN: Shelva Majestic, MD HISTORY FROM: Patient  REASON FOR VISIT: Headaches/Neck pain   HISTORICAL  CHIEF COMPLAINT:  Chief Complaint  Patient presents with   Follow-up    Rm12, alone, Cervicalgia S/P cervical discectomy: pt stated doing much better, no concerns   INTERVAL HISTORY 07/29/2023: Patient presents today for follow-up, last visit was a year ago, since then she has been doing well.  She reports that her neck pain has largely improved, currently she does not have any concerns or complaints.  Overall she is doing well.  Tizanidine has been very helpful. Headaches are better.   INTERVAL HISTORY 07/16/2022 Patient presents today for follow-up, last visit was in November last year.  At that time we had recommended her to continue with Elavil nightly but she reports she has not been taking it the right way, she takes it sporadically, as needed basis.  Currently she reports her headaches are better, she is getting about 5 headaches per month.  She is doing dry needling which has helped a lot.  She has not been taking the sumatriptan.  She also reported that Aleve helps with the headaches.  She feels the headaches are from the right side of her neck and behind the right ear she does not feel any pain anywhere else. She has not tried massage therapy    INTERVAL HISTORY 07/16/2021: Patient presents today for follow up. Reports that headaches are better, Elavil works well for her but makes her sleepy. Report stop taking Elavil currently due to increase work demand and she wants to be productive, currently having 3 to 4 dull headaches per week. Will restart the Elavil next week but at a lower dose.  No other concerns or complaints.    HISTORY OF PRESENT ILLNESS:  This is a 42 year old woman with past medical history of hypertension, chronic headaches who is presenting for headaches, neck  pain and left shoulder pain and numbness.  For her headaches patient reported she has been having headache for about 20 years, described the headaches are all over her head, pulsatile pain.  For headache she has tried ibuprofen with some minimal relief, reported that 1 her doctor previously had put her on a abortive medication possibly sumatriptan but she has never been on a preventive medication.  She does have sensitivity to light with her headaches but no nausea no vomiting no photophobia.  She does not describe any auras.  Patient reports in the month of May-June she has been having worsening headache, in the back of the head with worsening neck pain and left shoulder pain and numbness of the left arm going down to her fingertips.  She had MRI and MRA of the head and also MRI of the neck which showed a large dissecting left paracentral foraminal disc protrusion with severe stenosis of the left aspect of the canal. The cord is deformed and displaced to the right.  She is status post cervical disc fusion about 2 weeks ago.  Reported since her surgery her headaches are still there but they are mild not sure it is related to the surgery itself or related to the pain medication that she is taking for the surgery.  She is also here to discuss her Brain MRI results.    Headache History and Characteristics: Onset: 20 years but worse since May Location: Diffuse Quality: Pulsating  Intensity: 7/10.  Migrainous Features: Photophobia, phonophobia, nausea, vomiting.  Aura: No  History of brain injury or tumor: No  Family history: 57 yo Daughter with headaches  Motion sickness: no Cardiac history: no  OTC: tylenol, codeine Caffeine: soda once a week  Sleep: Will get about 4 to 5 hours per night  Mood/ Stress: Stress is low, mood is fine   Prior prophylaxis: Propranolol: No  Verapamil:No TCA: No Topamax: No Depakote: No Effexor: No Cymbalta: No Neurontin:No  Prior abortives: Triptan:  Possibly Anti-emetic: No Steroids: No Ergotamine suppository: No  Prior interventions: None   OTHER MEDICAL CONDITIONS: Chronic headaches, hypertension,    REVIEW OF SYSTEMS: Full 14 system review of systems performed and negative with exception of: as noted in the HPI  ALLERGIES: No Known Allergies  HOME MEDICATIONS: Outpatient Medications Prior to Visit  Medication Sig Dispense Refill   amLODipine (NORVASC) 2.5 MG tablet TAKE 1 TABLET DAILY 90 tablet 3   aspirin EC 81 MG tablet Take 1 tablet (81 mg total) by mouth daily. Swallow whole. 90 tablet 3   Drospirenone 4 MG TABS Take 4 mg by mouth daily.     hydrochlorothiazide (MICROZIDE) 12.5 MG capsule TAKE 1 CAPSULE BY MOUTH EVERY DAY 90 capsule 3   Pediatric Multivit-Minerals-C (MULTIVITAMIN CHILDRENS GUMMIES PO) Take 2 each by mouth daily. Unknown strength     albuterol (VENTOLIN HFA) 108 (90 Base) MCG/ACT inhaler Inhale 2 puffs into the lungs every 6 (six) hours as needed for wheezing or shortness of breath. 1 each 0   No facility-administered medications prior to visit.    PAST MEDICAL HISTORY: Past Medical History:  Diagnosis Date   Chest pain    Chicken pox    Chronic headaches    Hypertension    Shoulder pain    UTI (urinary tract infection)     PAST SURGICAL HISTORY: Past Surgical History:  Procedure Laterality Date   CERVICAL DISC ARTHROPLASTY N/A 03/28/2021   Procedure: Artifical disc replacement, cervical five -six, cervical six-seven;  Surgeon: Bethann Goo, DO;  Location: [facility] OR;  Service: Neurosurgery;  Laterality: N/A;  3C   CESAREAN SECTION     2002,2007,2010   TUBAL LIGATION  2010    FAMILY HISTORY: Family History  Problem Relation Age of Onset   Asthma Mother    Diabetes Mother    Healthy Father    Kidney Stones Brother    Kidney Stones Sister    Kidney Stones Brother    Other Maternal Grandmother        49 years old in 2021    SOCIAL HISTORY: Social History   Socioeconomic History    Marital status: Married    Spouse name: Not on file   Number of children: 3   Years of education: 12   Highest education level: Not on file  Occupational History   Not on file  Tobacco Use   Smoking status: Never   Smokeless tobacco: Never  Vaping Use   Vaping status: Never Used  Substance and Sexual Activity   Alcohol use: No   Drug use: No   Sexual activity: Yes  Other Topics Concern   Not on file  Social History Narrative   Married. 3 daughters 70 (oldest married), 44, and 85 (involved with horses younger 2) in 2023.       Advertising account planner- owns own business   HS grad.       Hobbies:crafters, reading, family time, music   Social Drivers of Corporate investment banker Strain: Not on [employer]  Insecurity: Not on file  Transportation Needs: Not on file  Physical Activity: Not on file  Stress: Not on file  Social Connections: Not on file  Intimate Partner Violence: Not on file     PHYSICAL EXAM GENERAL EXAM/CONSTITUTIONAL: Vitals:  Vitals:   07/29/23 1000  BP: (!) 121/91  Pulse: 82  Weight: 236 lb 8 oz (107.3 kg)  Height: 5\' 8"  (1.727 m)    Body mass index is 35.96 kg/m. Wt Readings from Last 3 Encounters:  07/29/23 236 lb 8 oz (107.3 kg)  03/03/23 237 lb 6.4 oz (107.7 kg)  09/04/22 230 lb (104.3 kg)   Patient is in no distress; well developed, nourished and groomed   MUSCULOSKELETAL: Gait, strength, tone, movements noted in Neurologic exam below  NEUROLOGIC: MENTAL STATUS:  awake, alert, oriented to person, place and time recent and remote memory intact normal attention and concentration language fluent, comprehension intact, naming intact fund of knowledge appropriate  CRANIAL NERVE:  2nd, 3rd, 4th, 6th - visual fields full to confrontation, extraocular muscles intact, no nystagmus 5th - facial sensation symmetric 7th - facial strength symmetric 8th - hearing intact 9th - palate elevates symmetrically, uvula midline 11th - shoulder shrug  symmetric 12th - tongue protrusion midline  MOTOR:  normal bulk and tone, full strength.   SENSORY:  normal and symmetric to light touch  COORDINATION:  finger-nose-finger, fine finger movements normal  GAIT/STATION:  normal   DIAGNOSTIC DATA (LABS, IMAGING, TESTING) - I reviewed patient records, labs, notes, testing and imaging myself where available.  Lab Results  Component Value Date   WBC 8.3 03/03/2023   HGB 14.3 03/03/2023   HCT 42.1 03/03/2023   MCV 89.3 03/03/2023   PLT 255.0 03/03/2023      Component Value Date/Time   NA 138 03/03/2023 1055   NA 139 03/04/2021 0939   K 3.5 03/03/2023 1055   CL 102 03/03/2023 1055   CO2 24 03/03/2023 1055   GLUCOSE 101 (H) 03/03/2023 1055   BUN 9 03/03/2023 1055   BUN 15 03/04/2021 0939   CREATININE 0.73 03/03/2023 1055   CALCIUM 9.7 03/03/2023 1055   PROT 7.7 03/03/2023 1055   ALBUMIN 4.3 03/03/2023 1055   AST 27 03/03/2023 1055   ALT 29 03/03/2023 1055   ALKPHOS 104 03/03/2023 1055   BILITOT 0.5 03/03/2023 1055   GFRNONAA >60 01/27/2021 1038   Lab Results  Component Value Date   CHOL 194 03/03/2023   HDL 36.80 (L) 03/03/2023   LDLCALC 110 (H) 01/19/2022   LDLDIRECT 136.0 03/03/2023   TRIG 201.0 (H) 03/03/2023   CHOLHDL 5 03/03/2023   Lab Results  Component Value Date   HGBA1C 6.1 03/03/2023   No results found for: "VITAMINB12" Lab Results  Component Value Date   TSH 4.21 03/03/2023    MRI 01/28/2021: 1. No evidence of acute intracranial abnormality. Specifically, no acute infarct. 2. Pericallosal lipoma. 3. 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 No adjacent brain edema or substantial mass effect. 4. Mild chronic microvascular ischemic disease.   MRA Head 01/28/2021: No large vessel occlusion or proximal hemodynamically significant stenosis. Small (1-2 mm)  outpouching arising from the distal left M1 MCA appears to have a small vessel arising from its tip, compatible with an infundibulum. Small (1 mm) conical outpouching arising from the dista left l basilar artery with a small  artery rising from the tip, compatible infundibulum.   MRA Neck: No significant (greater than 50%) stenosis  MRI Cervical spine 02/14/2021:  1. At C5-C6, large inferiorly dissecting left paracentral/foraminal disc protrusion with severe stenosis of the left aspect of the canal and left C5-C6 foramen. The cord is deformed and displaced to the right. Moderate right foraminal stenosis. 2. At C6-C7, broad disc bulge which contacts and flattens the cord with mild to moderate canal stenosis and moderate right and mild left foraminal stenosis. 3. At C4-C5, moderate left foraminal stenosis.   ASSESSMENT AND PLAN  42 y.o. year old female with past medical history of chronic migraines, hypertension and recent cervical spine discectomy and fusion for a severe cervical stenosis presents today for follow-up.  She tells me that she is doing well, cervicalgia pain improved, headaches improve.  Currently no additional complaints or concerns.  In term of the abnormality seen on MRA, infundibulum, will repeat CT angiogram.  I will contact the patient to go over the results.  If normal, she can continue to follow with PCP and return as needed. She voiced understanding.    1. Cervicalgia   2. S/P cervical discectomy   3. Vascular abnormality      Patient Instructions  Continue current medications, gave patient 10 tablets of tizanidine as needed for cervicalgia Repeat CT angiogram to follow-up infundibulum versus aneurysm  Continue to follow with PCP and return as needed.    ----------------------------------------------------------------------------------------------------------------------------------------------- "An infundibulum (plural: infundibula) is a conical outpouching from an  artery (usually intracranial), with a broad base narrowing to an apex from which a vessel originates. The most common location for an infundibulum is the origin of the posterior communicating artery (PCOM) from the supraclinoid internal carotid artery. They are common, found in up to a quarter of all cerebral angiograms.   The main importance of an infundibulum is that it may be mistaken for a saccular (berry) aneurysm (which is rounded and has the branch at its base). An infundibulum in most cases measures less than 3 mm. Unlike an aneurysm, an infundibulum is not believed to be a risk for rupture and subarachnoid hemorrhage. Only very rarely does an infundibulum eventually develop into an aneurysm and it is generally not thought that an incidental infundibulum requires follow-up unless additional clinical concern is present. Prudent factors that may indicate follow-up include large size, family history of subarachnoid hemorrhage or aneurysm, connective tissue disorders or history of dissection, or an aneurysm elsewhere"   Reference: Bertram Gala, Ward [facility]anne [facility], [place] RS. The progression of an infundibulum to aneurysm formation and rupture: case report and literature review. Neurosurgery. 43 (6): 1445-8; discussion 1448-9  Orders Placed This Encounter  Procedures   CT ANGIO HEAD W OR WO CONTRAST     Meds ordered this encounter  Medications   tiZANidine (ZANAFLEX) 4 MG tablet    Sig: Take 1 tablet (4 mg total) by mouth every 6 (six) hours as needed for muscle spasms.    Dispense:  10 tablet    Refill:  0     Return if symptoms worsen or fail to improve.    Windell Norfolk, MD 07/29/2023, 10:21 AM  [facility] [address], [address] [place], [place] [address] [phone]

## 2023-08-03 ENCOUNTER — Telehealth: Payer: Self-pay | Admitting: Neurology

## 2023-08-03 NOTE — Telephone Encounter (Signed)
sent to GI they obtain [employer] auth [id number] 

## 2023-08-31 ENCOUNTER — Ambulatory Visit
Admission: RE | Admit: 2023-08-31 | Discharge: 2023-08-31 | Disposition: A | Discharge status: Home / Self Care | Payer: No Typology Code available for payment source | Source: Ambulatory Visit | Attending: Neurology

## 2023-08-31 DIAGNOSIS — I999 Unspecified disorder of circulatory system: Secondary | ICD-10-CM

## 2023-08-31 MED ORDER — IOPAMIDOL (ISOVUE-370) INJECTION 76%
75.0000 mL | Freq: Once | INTRAVENOUS | Status: AC | PRN
  Administered 2023-08-31: 75 mL via INTRAVENOUS

## 2023-09-01 ENCOUNTER — Ambulatory Visit: Payer: No Typology Code available for payment source | Admitting: Family Medicine

## 2023-09-01 ENCOUNTER — Encounter: Payer: Self-pay | Admitting: Family Medicine

## 2023-09-01 VITALS — BP 122/82 | HR 88 | Temp 97.5°F | Ht 68.0 in | Wt 234.0 lb

## 2023-09-01 DIAGNOSIS — R7303 Prediabetes: Secondary | ICD-10-CM

## 2023-09-01 DIAGNOSIS — I2510 Atherosclerotic heart disease of native coronary artery without angina pectoris: Secondary | ICD-10-CM | POA: Diagnosis not present

## 2023-09-01 DIAGNOSIS — I10 Essential (primary) hypertension: Secondary | ICD-10-CM | POA: Diagnosis not present

## 2023-09-01 DIAGNOSIS — E782 Mixed hyperlipidemia: Secondary | ICD-10-CM

## 2023-09-01 NOTE — Progress Notes (Signed)
 Phone [phone] In person visit   Subjective:   [name] is a 43 y.o. year old very pleasant female patient who presents for/with See problem oriented charting Chief Complaint  Patient presents with   Medical Management of Chronic Issues   Hypertension   Past Medical History-  Patient Active Problem List   Diagnosis Date Noted   Prediabetes 03/04/2023    Priority: Medium    Chronic headaches 05/26/2021    Priority: Medium    Essential hypertension 03/04/2021    Priority: Medium    Hyperlipidemia 02/10/2021    Priority: Medium    Dysmenorrhea 09/29/2019    Priority: Medium    Chicken pox 03/03/2021    Priority: Low   Dysplastic nevi 11/29/2012    Priority: Low   Atypical chest pain 10/11/2012    Priority: Low   Polyp of anterior nasal cavity 03/02/2012    Priority: Low   Atopic dermatitis 11/26/2009    Priority: Low   PLANTAR FASCIITIS 04/19/2008    Priority: Low   Shoulder pain 05/26/2021    Priority: 1.   Cervical radiculopathy at C6 03/28/2021    Priority: 1.   DISC DISEASE, CERVICAL 04/19/2008    Priority: 1.   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 from 2023 09/04/2022    Medications- reviewed and updated Current Outpatient Medications  Medication Sig Dispense Refill   amLODipine  (NORVASC ) 2.5 MG tablet TAKE 1 TABLET DAILY 90 tablet 3   aspirin  EC 81 MG tablet Take 1 tablet (81 mg total) by mouth daily. Swallow whole. 90 tablet 3   Drospirenone  4 MG TABS Take 4 mg by mouth daily.     hydrochlorothiazide  (MICROZIDE ) 12.5 MG capsule TAKE 1 CAPSULE BY MOUTH EVERY DAY 90 capsule 3   Pediatric Multivit-Minerals-C (MULTIVITAMIN CHILDRENS GUMMIES PO) Take 2 each by mouth daily. Unknown strength     tiZANidine  (ZANAFLEX ) 4 MG tablet Take 1 tablet (4 mg total) by mouth every 6 (six) hours as needed for muscle spasms. (Patient not taking: Reported on 09/01/2023) 10 tablet 0   No current  facility-administered medications for this visit.     Objective:  BP 122/82   Pulse 88   Temp (!) 97.5 F (36.4 C)   Ht 5\' 8"  (1.727 m)   Wt 234 lb (106.1 kg)   SpO2 98%   BMI 35.58 kg/m  Gen: NAD, resting comfortably CV: RRR no murmurs rubs or gallops Lungs: CTAB no crackles, wheeze, rhonchi Ext: trace edema Skin: warm, dry     Assessment and Plan   #social update- 2025 moving onto [place] acres with her horses- will add sheeps and possibly cows. Plans to plant flower bed and possible garden.  -she is looking into exploring what she personally might be interested in/passionate about/hobbies and is going to explore that further before next visit-  #hypertension S: medication: Amlodipine  2.5 mg, hydrochlorothiazide  12.5 mg. Very salt sensitive- has to be careful with fries in particular Home readings #s: no recent checks - may get new machine- threw away her old one BP Readings from Last 3 Encounters:  09/01/23 122/82  07/29/23 (!) 121/91  03/03/23 120/84  A/P: stable- continue current medicines   #hyperlipidemia S: Medication:None  CT coronary angiography did not show any significant obstructive disease on 03/10/2021 with CT calcium score of 0.38 and reassuring echocardiogram July 2022 -She strongly prefers to work on lifestyle but is taking aspirin  81 mg per Dr. Lucyann Sacks  Lab Results  Component Value Date   CHOL 194 03/03/2023   HDL 36.80 (L) 03/03/2023   LDLCALC 110 (H) 01/19/2022   LDLDIRECT 136.0 03/03/2023   TRIG 201.0 (H) 03/03/2023   CHOLHDL 5 03/03/2023  A/P: no prescription other than taking OTC (available over the counter without a prescription) aspirin  pre cardiology. She wants to work on healthy eating and regular exercise and weight loss over adding statin right now  # Dysmenorrhea-remains on birth control likely until menopause through Dr. Gina Lagos  # Chronic headaches- may be cervical spine related- tizanidine  helps -prior amitriptyline  at bedtime if poor  sleep and headaches returning with sumatriptan  as needed-has seen neurology Dr. Samara Crest  -CT angio head done 08/31/23  with infundibulum vs aneursm follow up   #morbid obesity status noted with BMI over 35 with hypertension and hyperlipidemia but weight is tending down- down 3 lbs from last visit  # Hyperglycemia/insulin resistance/prediabetes- peak a1c 6.15 February 2023 S:  Medication: none Exercise and diet- remaining very active- but could improve diet- trying to cook more at home  A/P: a1c was 6.1 last time- wants to work on healthy eating and regular exercise - is down 3 lbs from last visit but want her to continue to push. 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  Lab/Order associations:   ICD-10-CM   1. Essential hypertension  I10     2. Mixed hyperlipidemia  E78.2     3. Prediabetes  R73.03     4. Coronary artery disease involving native coronary artery of native heart without angina pectoris  I25.10     5. Morbid obesity (HCC) Chronic E66.01       No orders of the defined types were placed in this encounter.   Return precautions advised.  Clarisa Crooked, MD

## 2023-09-01 NOTE — Patient Instructions (Addendum)
 Since you were poked and prodded yesterday- opted out of labs today- we will definitely do those next visit  Keep working on healthy eating and regular exercise. If you feel like you are stagnant on weight loss- it is not the most fun but calorie counting with myfitnesspal does work   Recommended follow up: Return in about 6 months (around 02/29/2024) for physical or sooner if needed.Schedule b4 you leave. 7/17 or later

## 2023-09-10 ENCOUNTER — Encounter: Payer: Self-pay | Admitting: Neurology

## 2024-02-10 ENCOUNTER — Other Ambulatory Visit: Payer: Self-pay | Admitting: Family Medicine

## 2024-03-15 LAB — HM PAP SMEAR

## 2024-03-23 ENCOUNTER — Ambulatory Visit: Payer: Self-pay | Admitting: Family Medicine

## 2024-03-23 ENCOUNTER — Ambulatory Visit (INDEPENDENT_AMBULATORY_CARE_PROVIDER_SITE_OTHER): Payer: No Typology Code available for payment source | Admitting: Family Medicine

## 2024-03-23 ENCOUNTER — Encounter: Payer: Self-pay | Admitting: Family Medicine

## 2024-03-23 VITALS — BP 120/78 | HR 75 | Temp 98.1°F | Ht 67.25 in | Wt 233.4 lb

## 2024-03-23 DIAGNOSIS — Z131 Encounter for screening for diabetes mellitus: Secondary | ICD-10-CM | POA: Diagnosis not present

## 2024-03-23 DIAGNOSIS — E782 Mixed hyperlipidemia: Secondary | ICD-10-CM

## 2024-03-23 DIAGNOSIS — I10 Essential (primary) hypertension: Secondary | ICD-10-CM | POA: Diagnosis not present

## 2024-03-23 DIAGNOSIS — R7303 Prediabetes: Secondary | ICD-10-CM

## 2024-03-23 DIAGNOSIS — Z0000 Encounter for general adult medical examination without abnormal findings: Secondary | ICD-10-CM | POA: Diagnosis not present

## 2024-03-23 DIAGNOSIS — Z1211 Encounter for screening for malignant neoplasm of colon: Secondary | ICD-10-CM

## 2024-03-23 LAB — CBC WITH DIFFERENTIAL/PLATELET
Basophils Absolute: 0.1 K/uL (ref 0.0–0.1)
Basophils Relative: 0.8 % (ref 0.0–3.0)
Eosinophils Absolute: 0.1 K/uL (ref 0.0–0.7)
Eosinophils Relative: 0.9 % (ref 0.0–5.0)
HCT: 42.4 % (ref 36.0–46.0)
Hemoglobin: 14.3 g/dL (ref 12.0–15.0)
Lymphocytes Relative: 38.2 % (ref 12.0–46.0)
Lymphs Abs: 2.5 K/uL (ref 0.7–4.0)
MCHC: 33.7 g/dL (ref 30.0–36.0)
MCV: 88.3 fl (ref 78.0–100.0)
Monocytes Absolute: 0.4 K/uL (ref 0.1–1.0)
Monocytes Relative: 5.4 % (ref 3.0–12.0)
Neutro Abs: 3.6 K/uL (ref 1.4–7.7)
Neutrophils Relative %: 54.7 % (ref 43.0–77.0)
Platelets: 264 K/uL (ref 150.0–400.0)
RBC: 4.8 Mil/uL (ref 3.87–5.11)
RDW: 13.3 % (ref 11.5–15.5)
WBC: 6.6 K/uL (ref 4.0–10.5)

## 2024-03-23 LAB — COMPREHENSIVE METABOLIC PANEL WITH GFR
ALT: 32 U/L (ref 0–35)
AST: 26 U/L (ref 0–37)
Albumin: 4.3 g/dL (ref 3.5–5.2)
Alkaline Phosphatase: 96 U/L (ref 39–117)
BUN: 11 mg/dL (ref 6–23)
CO2: 23 meq/L (ref 19–32)
Calcium: 9.4 mg/dL (ref 8.4–10.5)
Chloride: 102 meq/L (ref 96–112)
Creatinine, Ser: 0.71 mg/dL (ref 0.40–1.20)
GFR: 102.95 mL/min (ref >60.00)
Glucose, Bld: 98 mg/dL (ref 70–99)
Potassium: 3.8 meq/L (ref 3.5–5.1)
Sodium: 139 meq/L (ref 135–145)
Total Bilirubin: 0.4 mg/dL (ref 0.2–1.2)
Total Protein: 7.7 g/dL (ref 6.0–8.3)

## 2024-03-23 LAB — LIPID PANEL
Cholesterol: 189 mg/dL (ref 0–200)
HDL: 35 mg/dL — ABNORMAL LOW (ref >39.00)
LDL Cholesterol: 126 mg/dL — ABNORMAL HIGH (ref 0–99)
NonHDL: 153.94
Total CHOL/HDL Ratio: 5
Triglycerides: 141 mg/dL (ref 0.0–149.0)
VLDL: 28.2 mg/dL (ref 0.0–40.0)

## 2024-03-23 LAB — HEMOGLOBIN A1C: Hgb A1c MFr Bld: 6.4 % (ref 4.6–6.5)

## 2024-03-23 NOTE — Patient Instructions (Addendum)
 Requesting pap records  Please stop by lab before you go If you have mychart- we will send your results within 3 business days of us  receiving them.  If you do not have mychart- we will call you about results within 5 business days of us  receiving them.  *please also note that you will see labs on mychart as soon as they post. I will later go in and write notes on them- will say notes from Dr. Katrinka Finn GI contact Please call to schedule visit and/or procedure IF you do not hear within a week Address: [address] [place], [place], [place] [address] Phone: [phone]   Recommended follow up: Return in about 6 months (around 09/23/2024) for followup or sooner if needed.Schedule b4 you leave.

## 2024-03-23 NOTE — Progress Notes (Signed)
 Phone [phone]   Subjective:  Patient presents today for their annual physical. Chief complaint-noted.   See problem oriented charting- ROS- full  review of systems was completed and negative Per full ROS sheet completed by patient except for topics noted under acute/chronic concerns  The following were reviewed and entered/updated in epic: Past Medical History:  Diagnosis Date   Chest pain    Chicken pox    Chronic headaches    Hypertension    Shoulder pain    UTI (urinary tract infection)    Patient Active Problem List   Diagnosis Date Noted   Prediabetes 03/04/2023    Priority: Medium    Chronic headaches 05/26/2021    Priority: Medium    Essential hypertension 03/04/2021    Priority: Medium    Hyperlipidemia 02/10/2021    Priority: Medium    Dysmenorrhea 09/29/2019    Priority: Medium    Chicken pox 03/03/2021    Priority: Low   Dysplastic nevi 11/29/2012    Priority: Low   Atypical chest pain 10/11/2012    Priority: Low   Polyp of anterior nasal cavity 03/02/2012    Priority: Low   Atopic dermatitis 11/26/2009    Priority: Low   PLANTAR FASCIITIS 04/19/2008    Priority: Low   Shoulder pain 05/26/2021    Priority: 1.   Cervical radiculopathy at C6 03/28/2021    Priority: 1.   DISC DISEASE, CERVICAL 04/19/2008    Priority: 1.   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 from 2023 09/04/2022   Past Surgical History:  Procedure Laterality Date   CERVICAL DISC ARTHROPLASTY N/A 03/28/2021   Procedure: Artifical disc replacement, cervical five -six, cervical six-seven;  Surgeon: Carollee Lani BROCKS, DO;  Location: [facility] OR;  Service: Neurosurgery;  Laterality: N/A;  3C   CESAREAN SECTION     2002,2007,2010   TUBAL LIGATION  2010    Family History  Problem Relation Age of Onset   Asthma Mother    Diabetes Mother    Healthy Father    Kidney Stones Brother    Kidney Stones Sister    Kidney Stones  Brother    Other Maternal Grandmother        34 years old in 2021    Medications- reviewed and updated Current Outpatient Medications  Medication Sig Dispense Refill   amLODipine  (NORVASC ) 2.5 MG tablet TAKE 1 TABLET DAILY 90 tablet 3   aspirin  EC 81 MG tablet Take 1 tablet (81 mg total) by mouth daily. Swallow whole. 90 tablet 3   Drospirenone  4 MG TABS Take 4 mg by mouth daily.     hydrochlorothiazide  (MICROZIDE ) 12.5 MG capsule TAKE 1 CAPSULE BY MOUTH EVERY DAY 90 capsule 3   Pediatric Multivit-Minerals-C (MULTIVITAMIN CHILDRENS GUMMIES PO) Take 2 each by mouth daily. Unknown strength     tiZANidine  (ZANAFLEX ) 4 MG tablet Take 1 tablet (4 mg total) by mouth every 6 (six) hours as needed for muscle spasms. (Patient not taking: Reported on 09/01/2023) 10 tablet 0   No current facility-administered medications for this visit.    Allergies-reviewed and updated No Known Allergies  Social History   Social History Narrative   Married. 3 daughters 32 (oldest married), 38, and 66 (involved with horses younger 2) in 2023.       Advertising account planner- owns own business   [employer] grad.       Hobbies:crafters, reading, family time, music   Objective  Objective:  BP 120/78   Pulse 75   Temp 98.1 F (36.7 C)   Ht 5' 7.25 (1.708 m)   Wt 233 lb 6.4 oz (105.9 kg)   SpO2 96%   BMI 36.28 kg/m  Gen: NAD, resting comfortably HEENT: Mucous membranes are moist. Oropharynx normal Neck: no thyromegaly CV: RRR no murmurs rubs or gallops Lungs: CTAB no crackles, wheeze, rhonchi Abdomen: soft/nontender/nondistended/normal bowel sounds. No rebound or guarding.  Ext: no edema Skin: warm, dry Neuro: grossly normal, moves all extremities, PERRLA   Assessment and Plan   43 y.o. female presenting for annual physical.  Health Maintenance counseling: 1. Anticipatory guidance: Patient counseled regarding regular dental exams -q6 months, eye exams - yearly,  avoiding smoking and second hand smoke ,  limiting alcohol to 1 beverage per day- doesn't drink , no illicit drugs .   2. Risk factor reduction:  Advised patient of need for regular exercise and diet rich and fruits and vegetables to reduce risk of heart attack and stroke.  Exercise- busy with move and active outside with the move and work being done- in long run hoping to get dedicated exercise regimen with goal 150 minutes a week. Doesn't love walking - may retry swimming. .  Diet/weight management-down 4 lbs in last year. Move forcing her to eat late and sometimes eat more and doesn't feel helping.  Wt Readings from Last 3 Encounters:  03/23/24 233 lb 6.4 oz (105.9 kg)  09/01/23 234 lb (106.1 kg)  07/29/23 236 lb 8 oz (107.3 kg)  3. Immunizations/screenings/ancillary studies- declines flu and COVID, otherwise up to date. HPV declines. HBV vaccine in childhood Immunization History  Administered Date(s) Administered   Tdap 01/02/2021  4. Cervical cancer screening- sees Dr. Evone and just seen- reports normal pap and team will request 5. Breast cancer screening-  breast exam with gyn and mammogram - pending for this year 6. Colon cancer screening - refer for first colonoscopy today to [facility] 7. Skin cancer screening- sees dermatology regularly- Dr. Bonney baize dermatology.  advised regular sunscreen use. Denies worrisome, changing, or new skin lesions.  8. Birth control/STD check- tubal ligation and on birth control for heavy cycles and monogamous.  9. Osteoporosis screening at 65- plan on this postmenopausal 10. Smoking associated screening - never smoker  Status of chronic or acute concerns   #social update- 2025 moving onto [place] acres with her horses- will add sheeps and cows and horses!  - move has been good. Moving out process -still trying to sell home- she's been holding on  #taking wellness vitamin pack- fish oil, magnesium, etc. Also takes one called glucoase support and a supplement to lose weight- feels better on  these - wants to check a1c/fasting sugar  #hypertension S: medication: Amlodipine  2.5 mg, hydrochlorothiazide  12.5 mg -reports salt sensitive Home readings #s: no recent checks BP Readings from Last 3 Encounters:  03/23/24 120/78  09/01/23 122/82  07/29/23 (!) 121/91  A/P: well controlled continue current medications   #hyperlipidemia S: Medication:None -Thankfully CT coronary angiography did not show any significant obstructive disease on 03/10/2021 with CT calcium score of 0.38 and reassuring echocardiogram July 2022 -She strongly prefers to work on lifestyle  but is taking aspirin  81 mg per Dr. Zelia  Lab Results  Component Value Date   CHOL 194 03/03/2023   HDL 36.80 (L) 03/03/2023   LDLCALC 110 (H) 01/19/2022   LDLDIRECT 136.0 03/03/2023   TRIG 201.0 (H) 03/03/2023   CHOLHDL 5 03/03/2023  A/P: lipids are  mild to moderately elevated but wants to focus on lifestyle still- update levels- considre repeat CT calcium in 2027 and if progressing may reconsider plan  # Dysmenorrhea-remains on birth control likely until menopause through Dr. Celesta- just saw last week   # Chronic headaches- may be cervical spine related- tizanidine  helps - not always using.  - in past has seen neurology Dr. Camara  -CT angio head done 08/31/23 -  with infundibulum vs aneurysm follow up - on repeat was only outpouching not aneurysm-   # Hyperglycemia/insulin resistance/prediabetes- peak a1c 6.15 February 2023 S:  Medication: none Exercise and diet- see above Lab Results  Component Value Date   HGBA1C 6.1 03/03/2023  A/P: hopefully stable- update a1c today. Continue without meds for now   #Prior discectomy and fusion with [facility] with Dr. Carollee - no recent issues  Recommended follow up: Return in about 6 months (around 09/23/2024) for followup or sooner if needed.Schedule b4 you leave.  Lab/Order associations: fasting   ICD-10-CM   1. Preventative health care  Z00.00     2.  Essential hypertension  I10     3. Mixed hyperlipidemia  E78.2 Comprehensive metabolic panel with GFR    CBC with Differential/Platelet    Lipid panel    4. Prediabetes  R73.03 Hemoglobin A1c    5. Screening for diabetes mellitus  Z13.1 Hemoglobin A1c    6. Screen for colon cancer  Z12.11 Ambulatory referral to Gastroenterology      No orders of the defined types were placed in this encounter.   Return precautions advised.  Garnette Lukes, MD

## 2024-07-28 ENCOUNTER — Other Ambulatory Visit: Payer: Self-pay | Admitting: Family Medicine

## 2024-09-12 ENCOUNTER — Other Ambulatory Visit: Payer: Self-pay

## 2024-09-12 MED ORDER — HYDROCHLOROTHIAZIDE 12.5 MG PO CAPS: 12.5000 mg | ORAL_CAPSULE | Freq: Every day | ORAL | 3 refills | Status: AC

## 2024-09-26 ENCOUNTER — Ambulatory Visit: Payer: Self-pay | Admitting: Family Medicine
